# Patient Record
Sex: Female | Born: 1975 | Race: Black or African American | Hispanic: No | Smoking: Never smoker
Health system: Southern US, Community
[De-identification: ages and names within clinical notes are randomized; demographics above are authoritative.]

## PROBLEM LIST (undated history)

## (undated) DIAGNOSIS — I10 Essential (primary) hypertension: Secondary | ICD-10-CM

## (undated) DIAGNOSIS — I341 Nonrheumatic mitral (valve) prolapse: Secondary | ICD-10-CM

## (undated) DIAGNOSIS — J45909 Unspecified asthma, uncomplicated: Secondary | ICD-10-CM

---

## 2003-10-11 ENCOUNTER — Other Ambulatory Visit: Payer: Self-pay

## 2006-02-17 ENCOUNTER — Ambulatory Visit: Payer: Self-pay

## 2006-04-11 ENCOUNTER — Ambulatory Visit: Payer: Self-pay | Admitting: Psychiatry

## 2006-05-30 ENCOUNTER — Ambulatory Visit: Payer: Self-pay | Admitting: Psychiatry

## 2007-02-14 ENCOUNTER — Ambulatory Visit: Payer: Self-pay | Admitting: Urology

## 2008-11-17 ENCOUNTER — Emergency Department: Payer: Self-pay | Admitting: Emergency Medicine

## 2009-05-05 ENCOUNTER — Ambulatory Visit: Payer: Self-pay

## 2010-08-06 ENCOUNTER — Ambulatory Visit: Payer: Self-pay | Admitting: Internal Medicine

## 2013-02-26 ENCOUNTER — Ambulatory Visit: Payer: Self-pay | Admitting: Internal Medicine

## 2015-12-28 DIAGNOSIS — R011 Cardiac murmur, unspecified: Secondary | ICD-10-CM | POA: Diagnosis not present

## 2015-12-28 DIAGNOSIS — R5383 Other fatigue: Secondary | ICD-10-CM | POA: Diagnosis not present

## 2015-12-28 DIAGNOSIS — E559 Vitamin D deficiency, unspecified: Secondary | ICD-10-CM | POA: Diagnosis not present

## 2015-12-28 DIAGNOSIS — K219 Gastro-esophageal reflux disease without esophagitis: Secondary | ICD-10-CM | POA: Diagnosis not present

## 2015-12-28 DIAGNOSIS — R319 Hematuria, unspecified: Secondary | ICD-10-CM | POA: Diagnosis not present

## 2015-12-28 DIAGNOSIS — D649 Anemia, unspecified: Secondary | ICD-10-CM | POA: Diagnosis not present

## 2015-12-28 DIAGNOSIS — E7800 Pure hypercholesterolemia, unspecified: Secondary | ICD-10-CM | POA: Diagnosis not present

## 2015-12-28 DIAGNOSIS — J45909 Unspecified asthma, uncomplicated: Secondary | ICD-10-CM | POA: Diagnosis not present

## 2016-01-12 DIAGNOSIS — L239 Allergic contact dermatitis, unspecified cause: Secondary | ICD-10-CM | POA: Diagnosis not present

## 2016-01-14 DIAGNOSIS — D500 Iron deficiency anemia secondary to blood loss (chronic): Secondary | ICD-10-CM | POA: Diagnosis not present

## 2016-01-14 DIAGNOSIS — E669 Obesity, unspecified: Secondary | ICD-10-CM | POA: Diagnosis not present

## 2016-01-14 DIAGNOSIS — I471 Supraventricular tachycardia: Secondary | ICD-10-CM | POA: Diagnosis not present

## 2016-01-29 ENCOUNTER — Emergency Department: Payer: BLUE CROSS/BLUE SHIELD

## 2016-01-29 ENCOUNTER — Emergency Department
Admission: EM | Admit: 2016-01-29 | Discharge: 2016-01-29 | Disposition: A | Discharge status: Home / Self Care | Payer: BLUE CROSS/BLUE SHIELD | Attending: Emergency Medicine | Admitting: Emergency Medicine

## 2016-01-29 DIAGNOSIS — Z8679 Personal history of other diseases of the circulatory system: Secondary | ICD-10-CM | POA: Insufficient documentation

## 2016-01-29 DIAGNOSIS — J45909 Unspecified asthma, uncomplicated: Secondary | ICD-10-CM | POA: Insufficient documentation

## 2016-01-29 DIAGNOSIS — R079 Chest pain, unspecified: Secondary | ICD-10-CM | POA: Diagnosis not present

## 2016-01-29 DIAGNOSIS — R0789 Other chest pain: Secondary | ICD-10-CM | POA: Insufficient documentation

## 2016-01-29 DIAGNOSIS — I10 Essential (primary) hypertension: Secondary | ICD-10-CM | POA: Insufficient documentation

## 2016-01-29 HISTORY — DX: Unspecified asthma, uncomplicated: J45.909

## 2016-01-29 HISTORY — DX: Nonrheumatic mitral (valve) prolapse: I34.1

## 2016-01-29 HISTORY — DX: Essential (primary) hypertension: I10

## 2016-01-29 LAB — BASIC METABOLIC PANEL
Anion gap: 9 (ref 5–15)
BUN: 12 mg/dL (ref 6–20)
CALCIUM: 9.8 mg/dL (ref 8.9–10.3)
CO2: 27 mmol/L (ref 22–32)
CREATININE: 0.81 mg/dL (ref 0.44–1.00)
Chloride: 105 mmol/L (ref 101–111)
Glucose, Bld: 99 mg/dL (ref 65–99)
POTASSIUM: 4.4 mmol/L (ref 3.5–5.1)
SODIUM: 141 mmol/L (ref 135–145)

## 2016-01-29 LAB — CBC
HCT: 35.2 % (ref 35.0–47.0)
Hemoglobin: 11.6 g/dL — ABNORMAL LOW (ref 12.0–16.0)
MCH: 27.7 pg (ref 26.0–34.0)
MCHC: 33.1 g/dL (ref 32.0–36.0)
MCV: 83.5 fL (ref 80.0–100.0)
PLATELETS: 259 10*3/uL (ref 150–440)
RBC: 4.21 MIL/uL (ref 3.80–5.20)
RDW: 13.6 % (ref 11.5–14.5)
WBC: 5 10*3/uL (ref 3.6–11.0)

## 2016-01-29 LAB — TROPONIN I

## 2016-01-29 NOTE — ED Notes (Signed)
Patient reports left sided chest discomfort for 1 week, patient also reports increased fatigue and shortness of breath when walking long distances. Pain is worse when she is active, pain is better with rest. Patient denies N/V/D, fever/chills.  Patient reports that she has a hx/o irregular heart rate and take Metoprolol for this. Patient is followed by Dr. Juel BurrowMasoud.

## 2016-01-29 NOTE — ED Notes (Signed)
Pt reports she does not need a wheelchair. Pt in no acute distress at this time and is able to ambulate independently. Pt verbalized understanding of follow up.

## 2016-01-29 NOTE — ED Notes (Addendum)
Pt reports to ED w/ c/o chest pain that started 7 days ago.  Pt sts that pain radiates to L arm off/on for week, but pt pain in chest has been constant heaviness.  Pt sts that lightheaded w/ walking. Ambulatory to room.  Pt sts she was seen by cardiologist last week and received "everything was good"

## 2016-01-29 NOTE — Discharge Instructions (Signed)
You have been seen in the emergency department today for chest pain. Your workup has shown normal results. As we discussed please follow-up with your primary care physician in the next 1-2 days for recheck. Return to the emergency department for any further chest pain, trouble breathing, or any other symptom personally concerning to yourself. ° °Nonspecific Chest Pain °It is often hard to find the cause of chest pain. There is always a chance that your pain could be related to something serious, such as a heart attack or a blood clot in your lungs. Chest pain can also be caused by conditions that are not life-threatening. If you have chest pain, it is very important to follow up with your doctor. ° °HOME CARE °· If you were prescribed an antibiotic medicine, finish it all even if you start to feel better. °· Avoid any activities that cause chest pain. °· Do not use any tobacco products, including cigarettes, chewing tobacco, or electronic cigarettes. If you need help quitting, ask your doctor. °· Do not drink alcohol. °· Take medicines only as told by your doctor. °· Keep all follow-up visits as told by your doctor. This is important. This includes any further testing if your chest pain does not go away. °· Your doctor may tell you to keep your head raised (elevated) while you sleep. °· Make lifestyle changes as told by your doctor. These may include: °¨ Getting regular exercise. Ask your doctor to suggest some activities that are safe for you. °¨ Eating a heart-healthy diet. Your doctor or a diet specialist (dietitian) can help you to learn healthy eating options. °¨ Maintaining a healthy weight. °¨ Managing diabetes, if necessary. °¨ Reducing stress. °GET HELP IF: °· Your chest pain does not go away, even after treatment. °· You have a rash with blisters on your chest. °· You have a fever. °GET HELP RIGHT AWAY IF: °· Your chest pain is worse. °· You have an increasing cough, or you cough up blood. °· You have  severe belly (abdominal) pain. °· You feel extremely weak. °· You pass out (faint). °· You have chills. °· You have sudden, unexplained chest discomfort. °· You have sudden, unexplained discomfort in your arms, back, neck, or jaw. °· You have shortness of breath at any time. °· You suddenly start to sweat, or your skin gets clammy. °· You feel nauseous. °· You vomit. °· You suddenly feel light-headed or dizzy. °· Your heart begins to beat quickly, or it feels like it is skipping beats. °These symptoms may be an emergency. Do not wait to see if the symptoms will go away. Get medical help right away. Call your local emergency services (911 in the [place] Do not drive yourself to the hospital. °  °This information is not intended to replace advice given to you by your health care provider. Make sure you discuss any questions you have with your health care provider. °  °Document Released: 02/29/2008 Document Revised: 10/03/2014 Document Reviewed: 04/18/2014 °Elsevier Interactive Patient Education ©2016 [employer] ° °

## 2016-01-29 NOTE — ED Provider Notes (Signed)
[facility] Emergency Department Provider Note  Time seen: 4:42 PM  I have reviewed the triage vital signs and the nursing notes.   HISTORY  Chief Complaint Chest Pain    HPI Ashley Leon is a 40 y.o. female with a past medical history of hypertension, asthma, mitral valve prolapse, presents the emergency department 1 week of intermittent left-sided chest discomfort. According to the patient for the past one week she has been experiencing intermittent chest heaviness which she describes as mild to moderate with exertion, but occasionally comes at rest as well. Denies any nausea, vomiting, diaphoresis, does state some mild shortness of breath with exertion as well. Denies any leg pain or swelling. States she was seen by her cardiologist Dr. Harl Bowie approximately one week ago when symptoms started, but was told everything looked well. Has not had a stress test for approximately 5 years. Denies any history of MI in the past. She does have history of mitral valve prolapse for which she follows up with Dr. Harl Bowie. Currently the patient states very slight left-sided chest heaviness. She does state occasional numbness/tingling in her left arm but denies any currently. He states his left arm numbness and tingling comes and goes even without the chest pain or pressure.     Past Medical History  Diagnosis Date  . Mitral prolapse   . Hypertension   . Asthma     There are no active problems to display for this patient.   History reviewed. No pertinent past surgical history.  No current outpatient prescriptions on file.  Allergies Review of patient's allergies indicates no known allergies.  No family history on file.  Social History Social History  Substance Use Topics  . Smoking status: Never Smoker   . Smokeless tobacco: None  . Alcohol Use: No    Review of Systems Constitutional: Negative for fever. Cardiovascular: Intermittent left-sided chest  heaviness for the past one week Respiratory: Intermittent shortness of breath with exertion 1 only Gastrointestinal: Negative for abdominal pain, vomiting and diarrhea. Musculoskeletal: Negative for back pain Neurological: Negative for headache 10-point ROS otherwise negative.  ____________________________________________   PHYSICAL EXAM:  VITAL SIGNS: ED Triage Vitals  Enc Vitals Group     BP 01/29/16 1241 173/72 mmHg     Pulse Rate 01/29/16 1241 75     Resp 01/29/16 1241 18     Temp 01/29/16 1241 98 F (36.7 C)     Temp Source 01/29/16 1241 Oral     SpO2 01/29/16 1241 100 %     Weight 01/29/16 1241 187 lb (84.823 kg)     Height 01/29/16 1241  (1.727 m)     Head Cir --      Peak Flow --      Pain Score 01/29/16 1237 7     Pain Loc --      Pain Edu? --      Excl. in GC? --     Constitutional: Alert and oriented. Well appearing and in no distress. Eyes: Normal exam ENT   Head: Normocephalic and atraumatic.   Mouth/Throat: Mucous membranes are moist. Cardiovascular: Normal rate, regular rhythm. No murmur Respiratory: Normal respiratory effort without tachypnea nor retractions. Breath sounds are clear  Gastrointestinal: Soft and nontender. No distention.   Musculoskeletal: Nontender with normal range of motion in all extremities. No lower extremity tenderness or edema. Neurologic:  Normal speech and language. No gross focal neurologic deficits Skin:  Skin is warm, dry and intact.  Psychiatric:  Mood and affect are normal.  ____________________________________________    EKG  EKG reviewed and interpreted by myself shows normal sinus rhythm at 67 bpm, narrow QRS, normal axis, normal intervals, no ST changes. Normal EKG.  ____________________________________________    RADIOLOGY  Chest x-ray shows no acute abnormality  ____________________________________________    INITIAL IMPRESSION / ASSESSMENT AND PLAN / ED COURSE  Pertinent labs & imaging  results that were available during my care of the patient were reviewed by me and considered in my medical decision making (see chart for details).  Patient presents the emergency department 1 week of intermittent left-sided chest heaviness and shortness of breath. Patient's workup today including labs and troponin are negative. Chest x-ray negative. EKG is very reassuring and normal in appearance. Overall the patient appears very well, no abnormalities on exam. I discussed with the patient as his symptoms have been ongoing for 1 week or do believe she would benefit from a stress test. I have sent a message to Dr. Harl BowieMassoud, and she will call Monday morning to arrange an appointment. I discussed strict return precautions, the patient is agreeable.  ____________________________________________   FINAL CLINICAL IMPRESSION(S) / ED DIAGNOSES  Chest pain   Minna AntisKevin Kimie Pidcock, MD 01/29/16 [phone]

## 2016-02-02 DIAGNOSIS — Z789 Other specified health status: Secondary | ICD-10-CM | POA: Diagnosis not present

## 2016-02-02 DIAGNOSIS — R0789 Other chest pain: Secondary | ICD-10-CM | POA: Diagnosis not present

## 2016-02-02 DIAGNOSIS — I471 Supraventricular tachycardia: Secondary | ICD-10-CM | POA: Diagnosis not present

## 2016-02-04 DIAGNOSIS — D500 Iron deficiency anemia secondary to blood loss (chronic): Secondary | ICD-10-CM | POA: Diagnosis not present

## 2016-02-04 DIAGNOSIS — E669 Obesity, unspecified: Secondary | ICD-10-CM | POA: Diagnosis not present

## 2016-02-04 DIAGNOSIS — I471 Supraventricular tachycardia: Secondary | ICD-10-CM | POA: Diagnosis not present

## 2016-02-04 DIAGNOSIS — R079 Chest pain, unspecified: Secondary | ICD-10-CM | POA: Diagnosis not present

## 2016-02-08 DIAGNOSIS — E669 Obesity, unspecified: Secondary | ICD-10-CM | POA: Diagnosis not present

## 2016-02-08 DIAGNOSIS — I471 Supraventricular tachycardia: Secondary | ICD-10-CM | POA: Diagnosis not present

## 2016-02-08 DIAGNOSIS — R079 Chest pain, unspecified: Secondary | ICD-10-CM | POA: Diagnosis not present

## 2016-04-01 DIAGNOSIS — E042 Nontoxic multinodular goiter: Secondary | ICD-10-CM | POA: Diagnosis not present

## 2016-04-01 DIAGNOSIS — K219 Gastro-esophageal reflux disease without esophagitis: Secondary | ICD-10-CM | POA: Diagnosis not present

## 2016-04-01 DIAGNOSIS — R5383 Other fatigue: Secondary | ICD-10-CM | POA: Diagnosis not present

## 2016-04-01 DIAGNOSIS — J45909 Unspecified asthma, uncomplicated: Secondary | ICD-10-CM | POA: Diagnosis not present

## 2016-04-01 DIAGNOSIS — E785 Hyperlipidemia, unspecified: Secondary | ICD-10-CM | POA: Diagnosis not present

## 2016-04-01 DIAGNOSIS — D649 Anemia, unspecified: Secondary | ICD-10-CM | POA: Diagnosis not present

## 2016-04-01 DIAGNOSIS — R011 Cardiac murmur, unspecified: Secondary | ICD-10-CM | POA: Diagnosis not present

## 2016-04-01 DIAGNOSIS — E559 Vitamin D deficiency, unspecified: Secondary | ICD-10-CM | POA: Diagnosis not present

## 2016-05-16 DIAGNOSIS — R5381 Other malaise: Secondary | ICD-10-CM | POA: Diagnosis not present

## 2016-05-16 DIAGNOSIS — G43911 Migraine, unspecified, intractable, with status migrainosus: Secondary | ICD-10-CM | POA: Diagnosis not present

## 2016-05-16 DIAGNOSIS — I471 Supraventricular tachycardia: Secondary | ICD-10-CM | POA: Diagnosis not present

## 2016-05-16 DIAGNOSIS — I10 Essential (primary) hypertension: Secondary | ICD-10-CM | POA: Diagnosis not present

## 2016-05-16 DIAGNOSIS — E784 Other hyperlipidemia: Secondary | ICD-10-CM | POA: Diagnosis not present

## 2016-05-23 DIAGNOSIS — R000 Tachycardia, unspecified: Secondary | ICD-10-CM | POA: Diagnosis not present

## 2016-05-23 DIAGNOSIS — I340 Nonrheumatic mitral (valve) insufficiency: Secondary | ICD-10-CM | POA: Diagnosis not present

## 2016-06-23 DIAGNOSIS — M25521 Pain in right elbow: Secondary | ICD-10-CM | POA: Diagnosis not present

## 2016-07-04 DIAGNOSIS — R319 Hematuria, unspecified: Secondary | ICD-10-CM | POA: Diagnosis not present

## 2016-07-04 DIAGNOSIS — R011 Cardiac murmur, unspecified: Secondary | ICD-10-CM | POA: Diagnosis not present

## 2016-07-04 DIAGNOSIS — J45909 Unspecified asthma, uncomplicated: Secondary | ICD-10-CM | POA: Diagnosis not present

## 2016-07-04 DIAGNOSIS — E042 Nontoxic multinodular goiter: Secondary | ICD-10-CM | POA: Diagnosis not present

## 2016-07-04 DIAGNOSIS — R1030 Lower abdominal pain, unspecified: Secondary | ICD-10-CM | POA: Diagnosis not present

## 2016-07-04 DIAGNOSIS — K219 Gastro-esophageal reflux disease without esophagitis: Secondary | ICD-10-CM | POA: Diagnosis not present

## 2016-08-01 DIAGNOSIS — K219 Gastro-esophageal reflux disease without esophagitis: Secondary | ICD-10-CM | POA: Diagnosis not present

## 2016-08-01 DIAGNOSIS — R5383 Other fatigue: Secondary | ICD-10-CM | POA: Diagnosis not present

## 2016-08-01 DIAGNOSIS — J45909 Unspecified asthma, uncomplicated: Secondary | ICD-10-CM | POA: Diagnosis not present

## 2016-08-01 DIAGNOSIS — R011 Cardiac murmur, unspecified: Secondary | ICD-10-CM | POA: Diagnosis not present

## 2016-08-01 DIAGNOSIS — E559 Vitamin D deficiency, unspecified: Secondary | ICD-10-CM | POA: Diagnosis not present

## 2016-08-01 DIAGNOSIS — E785 Hyperlipidemia, unspecified: Secondary | ICD-10-CM | POA: Diagnosis not present

## 2016-08-22 DIAGNOSIS — I32 Pericarditis in diseases classified elsewhere: Secondary | ICD-10-CM | POA: Diagnosis not present

## 2016-08-22 DIAGNOSIS — E669 Obesity, unspecified: Secondary | ICD-10-CM | POA: Diagnosis not present

## 2016-08-22 DIAGNOSIS — D649 Anemia, unspecified: Secondary | ICD-10-CM | POA: Diagnosis not present

## 2016-08-22 DIAGNOSIS — D500 Iron deficiency anemia secondary to blood loss (chronic): Secondary | ICD-10-CM | POA: Diagnosis not present

## 2016-11-03 DIAGNOSIS — R5383 Other fatigue: Secondary | ICD-10-CM | POA: Diagnosis not present

## 2016-11-03 DIAGNOSIS — R011 Cardiac murmur, unspecified: Secondary | ICD-10-CM | POA: Diagnosis not present

## 2016-11-03 DIAGNOSIS — K219 Gastro-esophageal reflux disease without esophagitis: Secondary | ICD-10-CM | POA: Diagnosis not present

## 2016-11-03 DIAGNOSIS — E559 Vitamin D deficiency, unspecified: Secondary | ICD-10-CM | POA: Diagnosis not present

## 2016-11-03 DIAGNOSIS — J45909 Unspecified asthma, uncomplicated: Secondary | ICD-10-CM | POA: Diagnosis not present

## 2016-11-03 DIAGNOSIS — D649 Anemia, unspecified: Secondary | ICD-10-CM | POA: Diagnosis not present

## 2016-11-03 DIAGNOSIS — E785 Hyperlipidemia, unspecified: Secondary | ICD-10-CM | POA: Diagnosis not present

## 2016-12-20 DIAGNOSIS — D649 Anemia, unspecified: Secondary | ICD-10-CM | POA: Diagnosis not present

## 2016-12-20 DIAGNOSIS — E669 Obesity, unspecified: Secondary | ICD-10-CM | POA: Diagnosis not present

## 2016-12-20 DIAGNOSIS — I32 Pericarditis in diseases classified elsewhere: Secondary | ICD-10-CM | POA: Diagnosis not present

## 2016-12-20 DIAGNOSIS — D500 Iron deficiency anemia secondary to blood loss (chronic): Secondary | ICD-10-CM | POA: Diagnosis not present

## 2017-02-14 DIAGNOSIS — R011 Cardiac murmur, unspecified: Secondary | ICD-10-CM | POA: Diagnosis not present

## 2017-02-14 DIAGNOSIS — D649 Anemia, unspecified: Secondary | ICD-10-CM | POA: Diagnosis not present

## 2017-02-14 DIAGNOSIS — E559 Vitamin D deficiency, unspecified: Secondary | ICD-10-CM | POA: Diagnosis not present

## 2017-02-14 DIAGNOSIS — J45909 Unspecified asthma, uncomplicated: Secondary | ICD-10-CM | POA: Diagnosis not present

## 2017-02-14 DIAGNOSIS — R200 Anesthesia of skin: Secondary | ICD-10-CM | POA: Diagnosis not present

## 2017-02-14 DIAGNOSIS — R5383 Other fatigue: Secondary | ICD-10-CM | POA: Diagnosis not present

## 2017-02-14 DIAGNOSIS — E785 Hyperlipidemia, unspecified: Secondary | ICD-10-CM | POA: Diagnosis not present

## 2017-03-23 DIAGNOSIS — D500 Iron deficiency anemia secondary to blood loss (chronic): Secondary | ICD-10-CM | POA: Diagnosis not present

## 2017-03-23 DIAGNOSIS — D649 Anemia, unspecified: Secondary | ICD-10-CM | POA: Diagnosis not present

## 2017-03-23 DIAGNOSIS — E669 Obesity, unspecified: Secondary | ICD-10-CM | POA: Diagnosis not present

## 2017-03-23 DIAGNOSIS — I32 Pericarditis in diseases classified elsewhere: Secondary | ICD-10-CM | POA: Diagnosis not present

## 2017-04-20 IMAGING — CR DG CHEST 2V
2 series · 2 of 2 positions shown · non-contrast
Comparison: August 06, 2010

CLINICAL DATA: Chest pain, primarily left-sided, for 1 week

EXAM:
CHEST  2 VIEW

[chest pa]
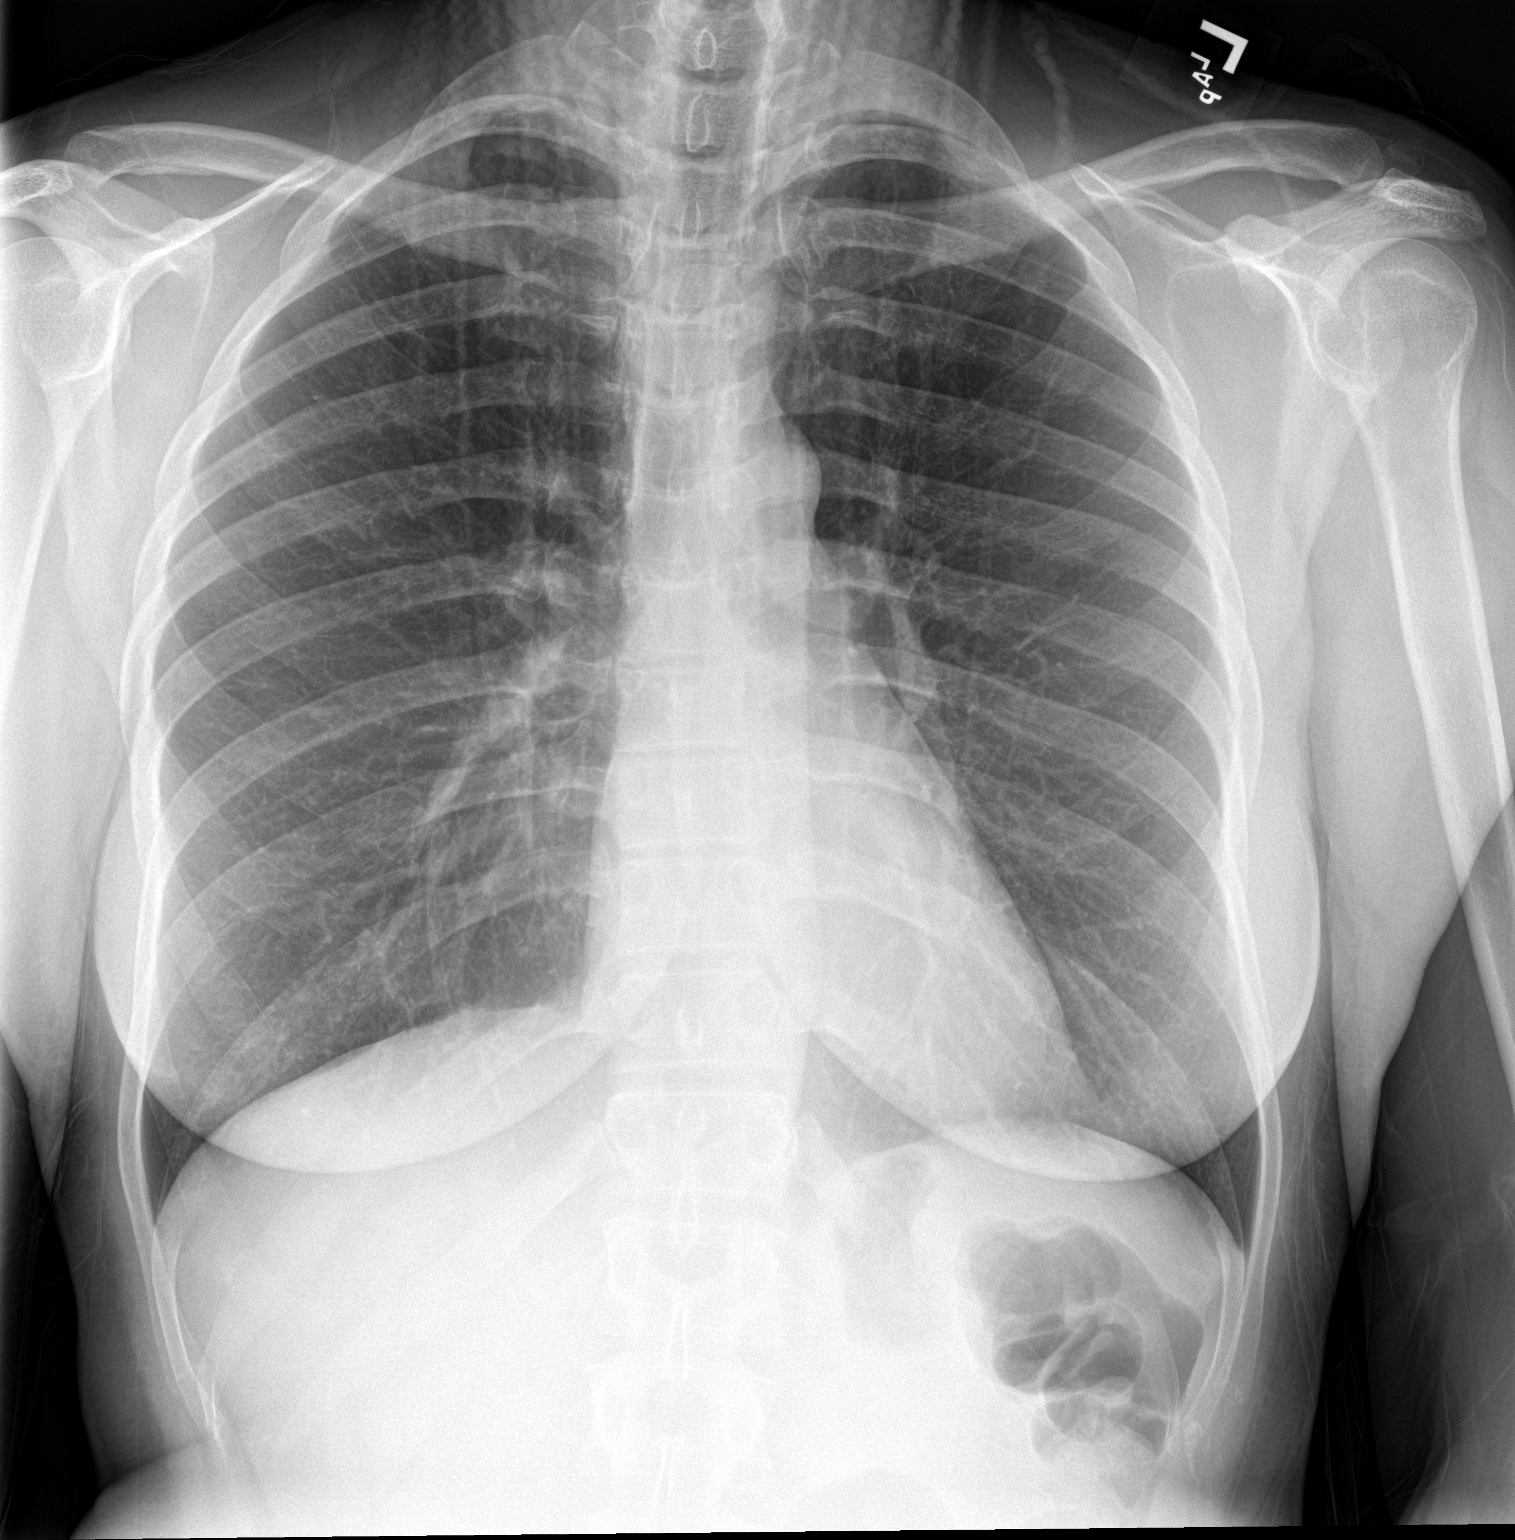

[chest lat]
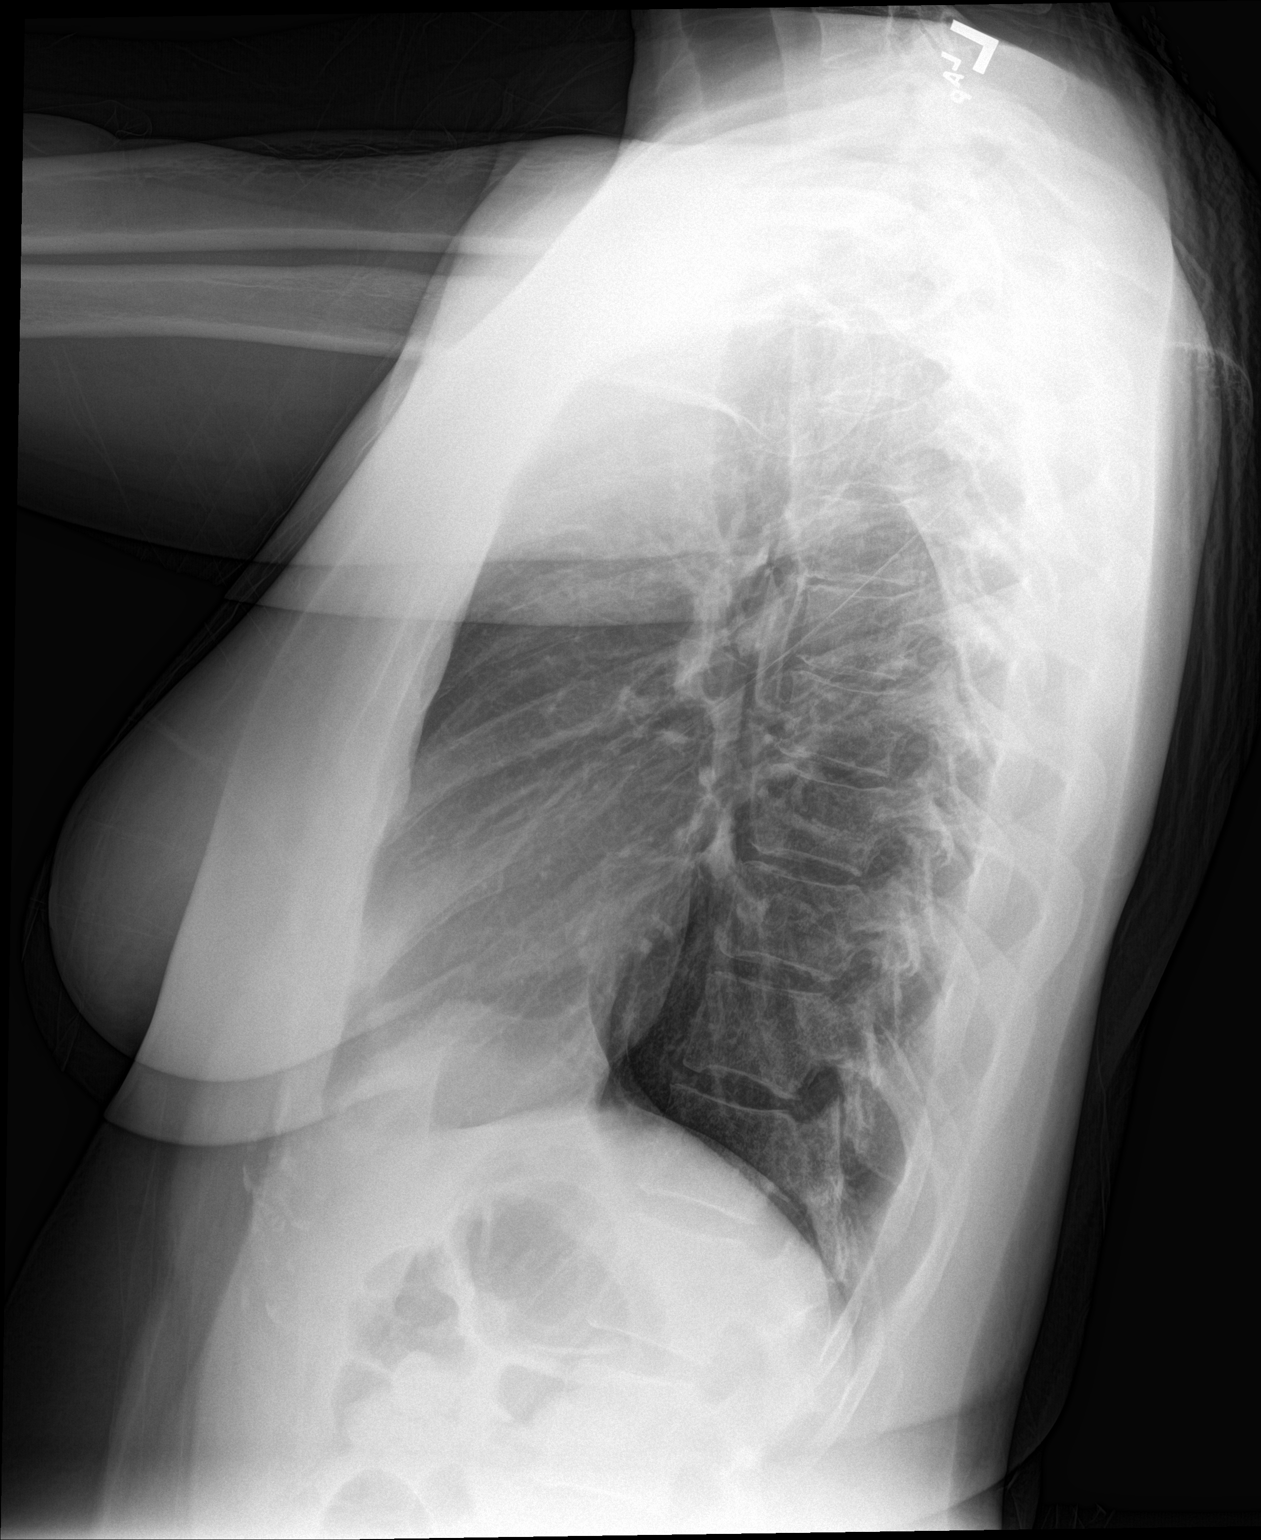

[2 of 2 positions shown; findings below may reference images not displayed]

FINDINGS: Lungs are clear. Heart size and pulmonary vascularity are normal. No
adenopathy. No pneumothorax. No bone lesions.
IMPRESSION: No edema or consolidation.

## 2017-05-11 DIAGNOSIS — D649 Anemia, unspecified: Secondary | ICD-10-CM | POA: Diagnosis not present

## 2017-05-11 DIAGNOSIS — E559 Vitamin D deficiency, unspecified: Secondary | ICD-10-CM | POA: Diagnosis not present

## 2017-05-11 DIAGNOSIS — K219 Gastro-esophageal reflux disease without esophagitis: Secondary | ICD-10-CM | POA: Diagnosis not present

## 2017-05-11 DIAGNOSIS — R011 Cardiac murmur, unspecified: Secondary | ICD-10-CM | POA: Diagnosis not present

## 2017-05-11 DIAGNOSIS — R5383 Other fatigue: Secondary | ICD-10-CM | POA: Diagnosis not present

## 2017-05-11 DIAGNOSIS — E785 Hyperlipidemia, unspecified: Secondary | ICD-10-CM | POA: Diagnosis not present

## 2017-05-11 DIAGNOSIS — J45909 Unspecified asthma, uncomplicated: Secondary | ICD-10-CM | POA: Diagnosis not present

## 2017-05-30 ENCOUNTER — Encounter: Payer: Self-pay | Admitting: Advanced Practice Midwife

## 2017-05-30 ENCOUNTER — Ambulatory Visit (INDEPENDENT_AMBULATORY_CARE_PROVIDER_SITE_OTHER): Payer: BLUE CROSS/BLUE SHIELD | Admitting: Advanced Practice Midwife

## 2017-05-30 VITALS — BP 124/70 | Ht 68.0 in | Wt 192.0 lb

## 2017-05-30 DIAGNOSIS — Z30011 Encounter for initial prescription of contraceptive pills: Secondary | ICD-10-CM

## 2017-05-30 DIAGNOSIS — Z01419 Encounter for gynecological examination (general) (routine) without abnormal findings: Secondary | ICD-10-CM

## 2017-05-30 DIAGNOSIS — Z124 Encounter for screening for malignant neoplasm of cervix: Secondary | ICD-10-CM

## 2017-05-30 DIAGNOSIS — I341 Nonrheumatic mitral (valve) prolapse: Secondary | ICD-10-CM | POA: Insufficient documentation

## 2017-05-30 MED ORDER — NORETHIN ACE-ETH ESTRAD-FE 1-20 MG-MCG PO TABS: 1.0000 | ORAL_TABLET | Freq: Every day | ORAL | 11 refills | Status: DC

## 2017-05-30 NOTE — Progress Notes (Signed)
Patient ID: Ashley Leon, female   DOB: 1975/09/28, 41 y.o.   MRN: [id number]    Gynecology Annual Exam  PCP: Evelene Croon, MD  Chief Complaint:  Chief Complaint  Patient presents with  . Annual Exam    History of Present Illness: Patient is a 41 y.o. G2P1011 presents for annual exam. The patient has no complaints today.   LMP: Patient's last menstrual period was 04/25/2017. Average Interval: irregular, every 1-2 months  Duration of flow: 5 days Heavy Menses: no Clots: no Intermenstrual Bleeding: no Postcoital Bleeding: no Dysmenorrhea: no   The patient is sexually active. She currently uses none for contraception. She denies dyspareunia.  The patient does perform self breast exams.  There is no notable family history of breast or ovarian cancer in her family.  The patient wears seatbelts: yes.   The patient has regular exercise: yes.    The patient denies current symptoms of depression.    Review of Systems: Review of Systems  Constitutional: Negative.   HENT: Negative.   Eyes: Negative.   Respiratory: Negative.   Cardiovascular: Negative.   Gastrointestinal: Negative.   Genitourinary: Negative.   Musculoskeletal: Negative.   Skin: Negative.   Neurological: Negative.   Endo/Heme/Allergies: Negative.   Psychiatric/Behavioral: Negative.     Past Medical History:  Past Medical History:  Diagnosis Date  . Asthma   . Hypertension   . Mitral prolapse     Past Surgical History:  History reviewed. No pertinent surgical history.  Gynecologic History:  Patient's last menstrual period was 04/25/2017. Contraception: none Last Pap: 1.5 years ago Results were:  no abnormalities  Last mammogram: several years ago, Results were: BI-RAD I Obstetric History: G2P1011  Family History:  History reviewed. No pertinent family history.  Social History:  Social History   Social History  . Marital status: Divorced    Spouse name: N/A  . Number of  children: N/A  . Years of education: N/A   Occupational History  . Not on file.   Social History Main Topics  . Smoking status: Never Smoker  . Smokeless tobacco: Never Used  . Alcohol use No  . Drug use: No  . Sexual activity: Not Currently    Birth control/ protection: None   Other Topics Concern  . Not on file   Social History Narrative  . No narrative on file    Allergies:  No Known Allergies  Medications: Prior to Admission medications   Medication Sig Start Date End Date Taking? Authorizing Provider  amLODipine (NORVASC) 2.5 MG tablet  04/02/17   [provider]  BREO ELLIPTA 100-25 MCG/INH AEPB  05/11/17   [provider]  metoprolol tartrate (LOPRESSOR) 50 MG tablet Take 50 mg by mouth.    [provider]  pantoprazole (PROTONIX) 40 MG tablet  05/11/17   [provider]  PROAIR HFA 108 [phone] Base) MCG/ACT inhaler  05/11/17   [provider]  SUMAtriptan (IMITREX) 100 MG tablet  05/11/17   [provider]    Physical Exam Vitals: Blood pressure 124/70, height 5\' 8"  (1.727 m), weight 192 lb (87.1 kg), last menstrual period 04/25/2017.  General: NAD HEENT: normocephalic, anicteric Thyroid: no enlargement, no palpable nodules Pulmonary: No increased work of breathing, CTAB Cardiovascular: RRR, distal pulses 2+ Breast: Breast symmetrical, no tenderness, no palpable nodules or masses, no skin or nipple retraction present, no nipple discharge.  No axillary or supraclavicular lymphadenopathy. Abdomen: NABS, soft, non-tender, non-distended.  Umbilicus without lesions.  No hepatomegaly, splenomegaly or masses palpable. No evidence of hernia  Genitourinary:  External: Normal external female genitalia.  Normal urethral meatus, normal  Bartholin's and Skene's glands.    Vagina: Normal vaginal mucosa, no evidence of prolapse.    Cervix: Grossly normal in appearance, no bleeding, no CMT  Uterus: Non-enlarged, mobile, normal  contour.    Adnexa: ovaries non-enlarged, no adnexal masses  Rectal: deferred  Lymphatic: no evidence of inguinal lymphadenopathy Extremities: no edema, erythema, or tenderness Neurologic: Grossly intact Psychiatric: mood appropriate, affect full   Assessment: 41 y.o. G2P1011 routine annual exam  Plan: Problem List Items Addressed This Visit    None    Visit Diagnoses    Well woman exam with routine gynecological exam    -  Primary   Relevant Orders   IGP, Aptima HPV   Cervical cancer screening       Relevant Orders   IGP, Aptima HPV      1) Mammogram - recommend yearly screening mammogram.  Mammogram patient prefers to wait until next year   2) STI screening was offered and declined  3) ASCCP guidelines and rational discussed.  Patient opts for yearly screening interval  4) Contraception - patient would like to take birth control pills to regulate cycle  5) Colonoscopy -- Screening recommended starting at age 43 for average risk individuals, age 43 for individuals deemed at increased risk (including African Americans) and recommended to continue until age 43.  For patient age 43-85 individualized approach is recommended.  Gold standard screening is via colonoscopy, Cologuard screening is an acceptable alternative for patient unwilling or unable to undergo colonoscopy.  "Colorectal cancer screening for average?risk adults: 2018 guideline update from the American Cancer Society"CA: A Cancer Journal for Clinicians: Feb 22, 2017. Reviewed recommendations and patient prefers to wait  6) Routine healthcare maintenance including cholesterol, diabetes screening discussed managed by PCP  Tresea MallJane Nicollette Wilhelmi, CNM

## 2017-06-02 LAB — IGP, APTIMA HPV
HPV APTIMA: NEGATIVE
PAP SMEAR COMMENT: 0

## 2017-06-05 ENCOUNTER — Other Ambulatory Visit: Payer: Self-pay | Admitting: Internal Medicine

## 2017-06-05 ENCOUNTER — Ambulatory Visit
Admission: RE | Admit: 2017-06-05 | Discharge: 2017-06-05 | Disposition: A | Discharge status: Home / Self Care | Payer: BLUE CROSS/BLUE SHIELD | Source: Ambulatory Visit | Attending: Cardiology | Admitting: Cardiology

## 2017-06-05 ENCOUNTER — Ambulatory Visit
Admission: RE | Admit: 2017-06-05 | Discharge: 2017-06-05 | Disposition: A | Discharge status: Home / Self Care | Payer: BLUE CROSS/BLUE SHIELD | Source: Ambulatory Visit | Attending: Internal Medicine | Admitting: Internal Medicine

## 2017-06-05 DIAGNOSIS — D500 Iron deficiency anemia secondary to blood loss (chronic): Secondary | ICD-10-CM | POA: Diagnosis not present

## 2017-06-05 DIAGNOSIS — R079 Chest pain, unspecified: Secondary | ICD-10-CM | POA: Insufficient documentation

## 2017-06-05 DIAGNOSIS — D649 Anemia, unspecified: Secondary | ICD-10-CM | POA: Diagnosis not present

## 2017-06-05 DIAGNOSIS — I10 Essential (primary) hypertension: Secondary | ICD-10-CM | POA: Diagnosis not present

## 2017-06-05 DIAGNOSIS — G43911 Migraine, unspecified, intractable, with status migrainosus: Secondary | ICD-10-CM | POA: Diagnosis not present

## 2017-06-05 DIAGNOSIS — R5381 Other malaise: Secondary | ICD-10-CM | POA: Diagnosis not present

## 2017-06-05 DIAGNOSIS — E784 Other hyperlipidemia: Secondary | ICD-10-CM | POA: Diagnosis not present

## 2017-06-05 DIAGNOSIS — I32 Pericarditis in diseases classified elsewhere: Secondary | ICD-10-CM | POA: Diagnosis not present

## 2017-06-12 DIAGNOSIS — D500 Iron deficiency anemia secondary to blood loss (chronic): Secondary | ICD-10-CM | POA: Diagnosis not present

## 2017-06-12 DIAGNOSIS — R002 Palpitations: Secondary | ICD-10-CM | POA: Diagnosis not present

## 2017-06-12 DIAGNOSIS — J45909 Unspecified asthma, uncomplicated: Secondary | ICD-10-CM | POA: Diagnosis not present

## 2017-06-12 DIAGNOSIS — Z888 Allergy status to other drugs, medicaments and biological substances status: Secondary | ICD-10-CM | POA: Diagnosis not present

## 2017-06-14 DIAGNOSIS — R002 Palpitations: Secondary | ICD-10-CM | POA: Diagnosis not present

## 2017-06-14 DIAGNOSIS — R000 Tachycardia, unspecified: Secondary | ICD-10-CM | POA: Diagnosis not present

## 2017-06-14 DIAGNOSIS — I471 Supraventricular tachycardia: Secondary | ICD-10-CM | POA: Diagnosis not present

## 2017-07-18 DIAGNOSIS — I32 Pericarditis in diseases classified elsewhere: Secondary | ICD-10-CM | POA: Diagnosis not present

## 2017-07-18 DIAGNOSIS — D649 Anemia, unspecified: Secondary | ICD-10-CM | POA: Diagnosis not present

## 2017-07-18 DIAGNOSIS — I471 Supraventricular tachycardia: Secondary | ICD-10-CM | POA: Diagnosis not present

## 2017-07-18 DIAGNOSIS — I10 Essential (primary) hypertension: Secondary | ICD-10-CM | POA: Diagnosis not present

## 2017-08-25 DIAGNOSIS — K219 Gastro-esophageal reflux disease without esophagitis: Secondary | ICD-10-CM | POA: Diagnosis not present

## 2017-08-25 DIAGNOSIS — R319 Hematuria, unspecified: Secondary | ICD-10-CM | POA: Diagnosis not present

## 2017-08-25 DIAGNOSIS — R011 Cardiac murmur, unspecified: Secondary | ICD-10-CM | POA: Diagnosis not present

## 2017-08-25 DIAGNOSIS — D649 Anemia, unspecified: Secondary | ICD-10-CM | POA: Diagnosis not present

## 2017-08-25 DIAGNOSIS — J45909 Unspecified asthma, uncomplicated: Secondary | ICD-10-CM | POA: Diagnosis not present

## 2017-09-29 DIAGNOSIS — E785 Hyperlipidemia, unspecified: Secondary | ICD-10-CM | POA: Diagnosis not present

## 2017-09-29 DIAGNOSIS — R5383 Other fatigue: Secondary | ICD-10-CM | POA: Diagnosis not present

## 2017-09-29 DIAGNOSIS — R011 Cardiac murmur, unspecified: Secondary | ICD-10-CM | POA: Diagnosis not present

## 2017-09-29 DIAGNOSIS — E559 Vitamin D deficiency, unspecified: Secondary | ICD-10-CM | POA: Diagnosis not present

## 2017-09-29 DIAGNOSIS — K219 Gastro-esophageal reflux disease without esophagitis: Secondary | ICD-10-CM | POA: Diagnosis not present

## 2017-09-29 DIAGNOSIS — J45909 Unspecified asthma, uncomplicated: Secondary | ICD-10-CM | POA: Diagnosis not present

## 2017-09-29 DIAGNOSIS — R319 Hematuria, unspecified: Secondary | ICD-10-CM | POA: Diagnosis not present

## 2017-10-17 DIAGNOSIS — D649 Anemia, unspecified: Secondary | ICD-10-CM | POA: Diagnosis not present

## 2017-10-17 DIAGNOSIS — I32 Pericarditis in diseases classified elsewhere: Secondary | ICD-10-CM | POA: Diagnosis not present

## 2017-10-17 DIAGNOSIS — Z888 Allergy status to other drugs, medicaments and biological substances status: Secondary | ICD-10-CM | POA: Diagnosis not present

## 2017-10-17 DIAGNOSIS — R002 Palpitations: Secondary | ICD-10-CM | POA: Diagnosis not present

## 2017-12-29 DIAGNOSIS — E559 Vitamin D deficiency, unspecified: Secondary | ICD-10-CM | POA: Diagnosis not present

## 2017-12-29 DIAGNOSIS — K219 Gastro-esophageal reflux disease without esophagitis: Secondary | ICD-10-CM | POA: Diagnosis not present

## 2017-12-29 DIAGNOSIS — D649 Anemia, unspecified: Secondary | ICD-10-CM | POA: Diagnosis not present

## 2017-12-29 DIAGNOSIS — R011 Cardiac murmur, unspecified: Secondary | ICD-10-CM | POA: Diagnosis not present

## 2017-12-29 DIAGNOSIS — E785 Hyperlipidemia, unspecified: Secondary | ICD-10-CM | POA: Diagnosis not present

## 2017-12-29 DIAGNOSIS — R5383 Other fatigue: Secondary | ICD-10-CM | POA: Diagnosis not present

## 2017-12-29 DIAGNOSIS — J45909 Unspecified asthma, uncomplicated: Secondary | ICD-10-CM | POA: Diagnosis not present

## 2018-01-15 DIAGNOSIS — I10 Essential (primary) hypertension: Secondary | ICD-10-CM | POA: Diagnosis not present

## 2018-01-15 DIAGNOSIS — E669 Obesity, unspecified: Secondary | ICD-10-CM | POA: Diagnosis not present

## 2018-01-15 DIAGNOSIS — G43909 Migraine, unspecified, not intractable, without status migrainosus: Secondary | ICD-10-CM | POA: Diagnosis not present

## 2018-01-15 DIAGNOSIS — I471 Supraventricular tachycardia: Secondary | ICD-10-CM | POA: Diagnosis not present

## 2018-03-30 DIAGNOSIS — R319 Hematuria, unspecified: Secondary | ICD-10-CM | POA: Diagnosis not present

## 2018-03-30 DIAGNOSIS — E785 Hyperlipidemia, unspecified: Secondary | ICD-10-CM | POA: Diagnosis not present

## 2018-03-30 DIAGNOSIS — J45909 Unspecified asthma, uncomplicated: Secondary | ICD-10-CM | POA: Diagnosis not present

## 2018-03-30 DIAGNOSIS — K219 Gastro-esophageal reflux disease without esophagitis: Secondary | ICD-10-CM | POA: Diagnosis not present

## 2018-03-30 DIAGNOSIS — R5383 Other fatigue: Secondary | ICD-10-CM | POA: Diagnosis not present

## 2018-03-30 DIAGNOSIS — G43909 Migraine, unspecified, not intractable, without status migrainosus: Secondary | ICD-10-CM | POA: Diagnosis not present

## 2018-04-16 ENCOUNTER — Ambulatory Visit
Admission: RE | Admit: 2018-04-16 | Discharge: 2018-04-16 | Disposition: A | Discharge status: Home / Self Care | Payer: BLUE CROSS/BLUE SHIELD | Source: Ambulatory Visit | Attending: Internal Medicine | Admitting: Internal Medicine

## 2018-04-16 ENCOUNTER — Other Ambulatory Visit: Payer: Self-pay | Admitting: Internal Medicine

## 2018-04-16 DIAGNOSIS — R52 Pain, unspecified: Secondary | ICD-10-CM

## 2018-04-16 DIAGNOSIS — D500 Iron deficiency anemia secondary to blood loss (chronic): Secondary | ICD-10-CM | POA: Diagnosis not present

## 2018-04-16 DIAGNOSIS — H269 Unspecified cataract: Secondary | ICD-10-CM | POA: Diagnosis not present

## 2018-04-16 DIAGNOSIS — M940 Chondrocostal junction syndrome [Tietze]: Secondary | ICD-10-CM | POA: Diagnosis not present

## 2018-04-16 DIAGNOSIS — R002 Palpitations: Secondary | ICD-10-CM | POA: Diagnosis not present

## 2018-04-16 DIAGNOSIS — R079 Chest pain, unspecified: Secondary | ICD-10-CM | POA: Insufficient documentation

## 2018-04-23 DIAGNOSIS — R002 Palpitations: Secondary | ICD-10-CM | POA: Diagnosis not present

## 2018-04-23 DIAGNOSIS — M940 Chondrocostal junction syndrome [Tietze]: Secondary | ICD-10-CM | POA: Diagnosis not present

## 2018-04-23 DIAGNOSIS — D649 Anemia, unspecified: Secondary | ICD-10-CM | POA: Diagnosis not present

## 2018-04-23 DIAGNOSIS — D500 Iron deficiency anemia secondary to blood loss (chronic): Secondary | ICD-10-CM | POA: Diagnosis not present

## 2018-06-28 DIAGNOSIS — M2141 Flat foot [pes planus] (acquired), right foot: Secondary | ICD-10-CM | POA: Diagnosis not present

## 2018-06-28 DIAGNOSIS — M2142 Flat foot [pes planus] (acquired), left foot: Secondary | ICD-10-CM | POA: Diagnosis not present

## 2018-06-28 DIAGNOSIS — M76822 Posterior tibial tendinitis, left leg: Secondary | ICD-10-CM | POA: Diagnosis not present

## 2018-06-28 DIAGNOSIS — M79672 Pain in left foot: Secondary | ICD-10-CM | POA: Diagnosis not present

## 2018-06-29 DIAGNOSIS — R319 Hematuria, unspecified: Secondary | ICD-10-CM | POA: Diagnosis not present

## 2018-06-29 DIAGNOSIS — J45909 Unspecified asthma, uncomplicated: Secondary | ICD-10-CM | POA: Diagnosis not present

## 2018-06-29 DIAGNOSIS — R011 Cardiac murmur, unspecified: Secondary | ICD-10-CM | POA: Diagnosis not present

## 2018-06-29 DIAGNOSIS — K219 Gastro-esophageal reflux disease without esophagitis: Secondary | ICD-10-CM | POA: Diagnosis not present

## 2018-06-29 DIAGNOSIS — Z1331 Encounter for screening for depression: Secondary | ICD-10-CM | POA: Diagnosis not present

## 2018-07-24 DIAGNOSIS — M940 Chondrocostal junction syndrome [Tietze]: Secondary | ICD-10-CM | POA: Diagnosis not present

## 2018-07-24 DIAGNOSIS — R002 Palpitations: Secondary | ICD-10-CM | POA: Diagnosis not present

## 2018-07-24 DIAGNOSIS — I32 Pericarditis in diseases classified elsewhere: Secondary | ICD-10-CM | POA: Diagnosis not present

## 2018-07-24 DIAGNOSIS — D500 Iron deficiency anemia secondary to blood loss (chronic): Secondary | ICD-10-CM | POA: Diagnosis not present

## 2018-07-30 DIAGNOSIS — M76822 Posterior tibial tendinitis, left leg: Secondary | ICD-10-CM | POA: Diagnosis not present

## 2018-08-26 IMAGING — CR DG CHEST 2V
1 series · 2 of 2 positions shown · non-contrast
Comparison: PA and lateral chest x-ray January 29, 2016

CLINICAL DATA: Left-sided chest pain for the past week. History of
asthma and mitral valve prolapse.

EXAM:
CHEST  2 VIEW

[Series 1: dg chest 2 view · 0.14mm/px · 2 of 2 slices shown]
[im 1/2]
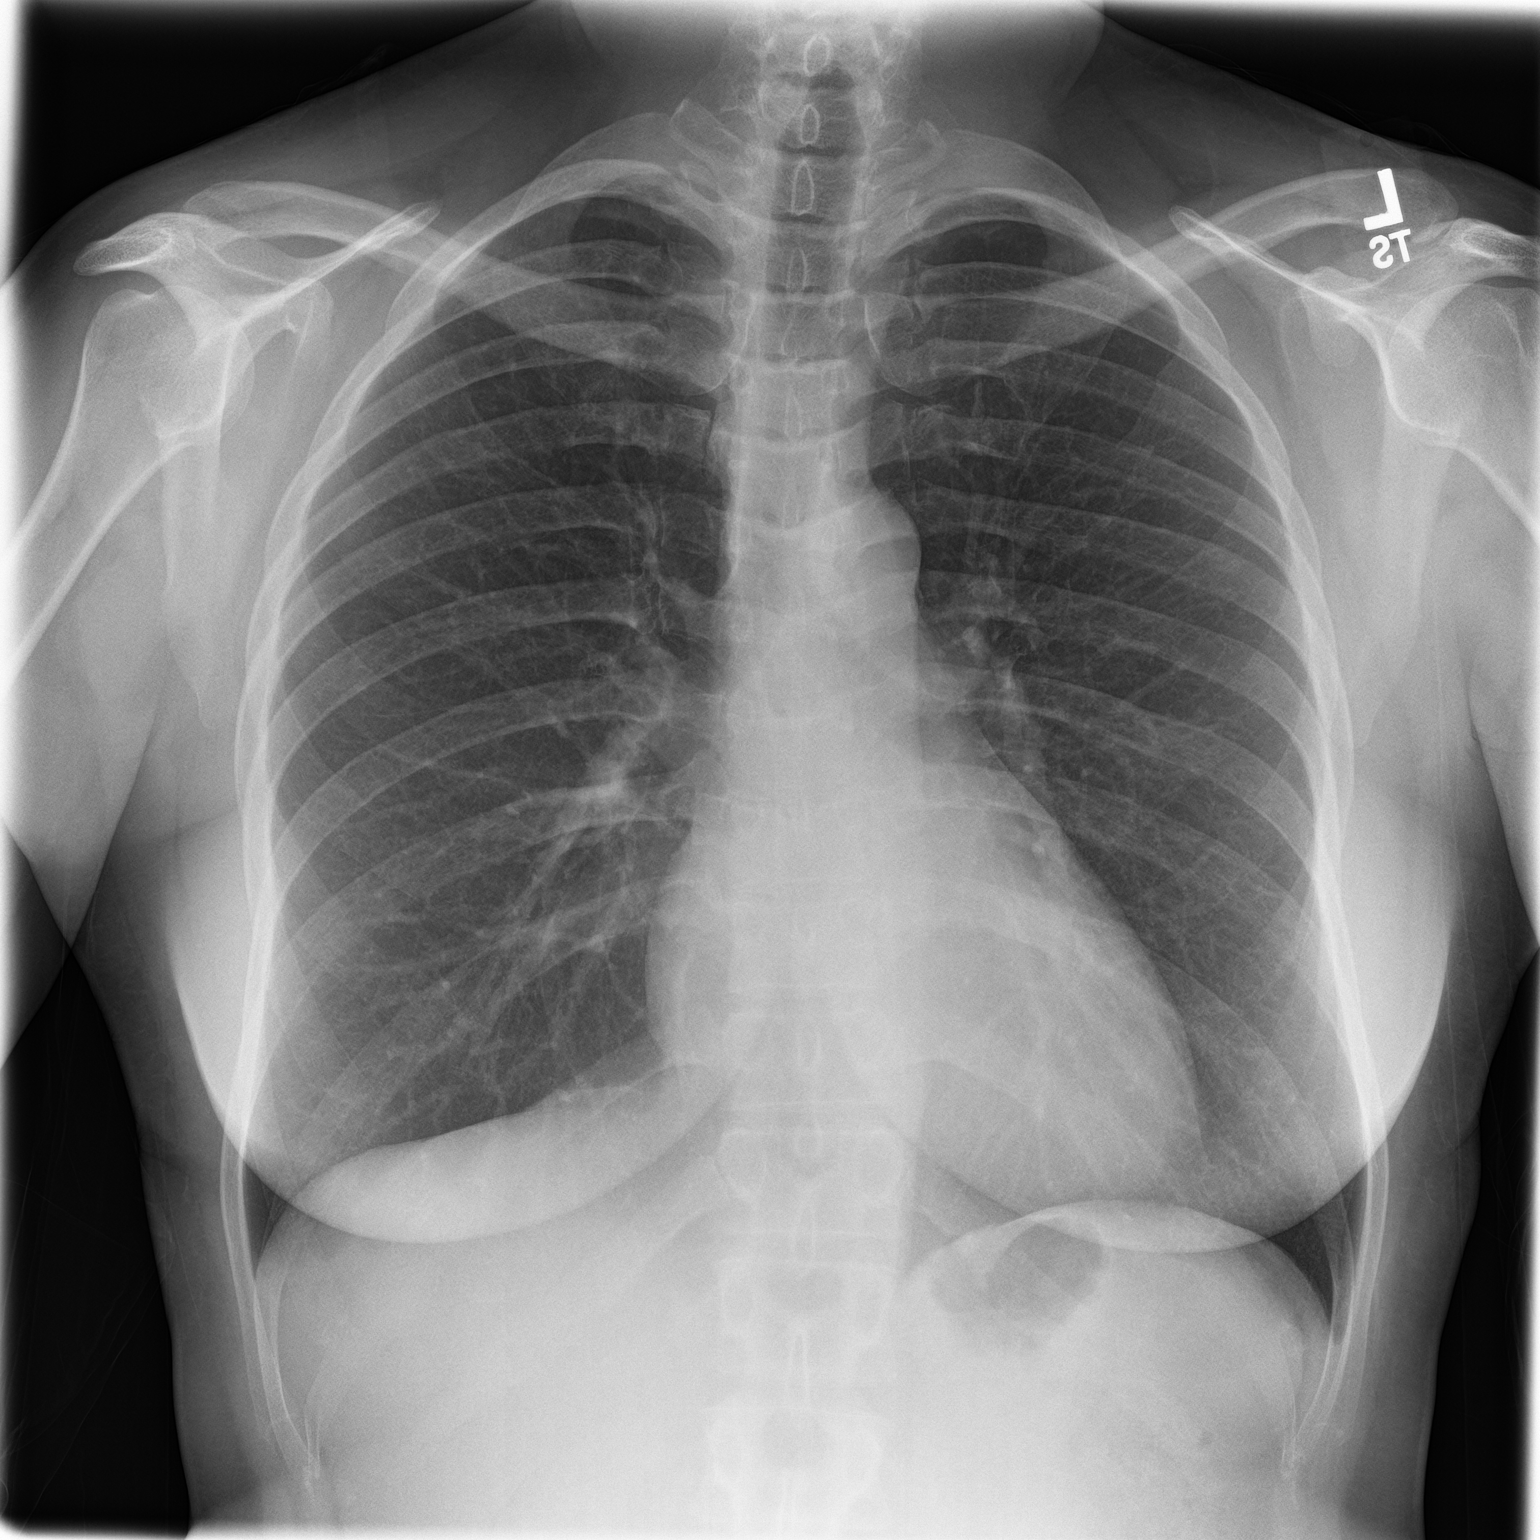
[im 2/2]
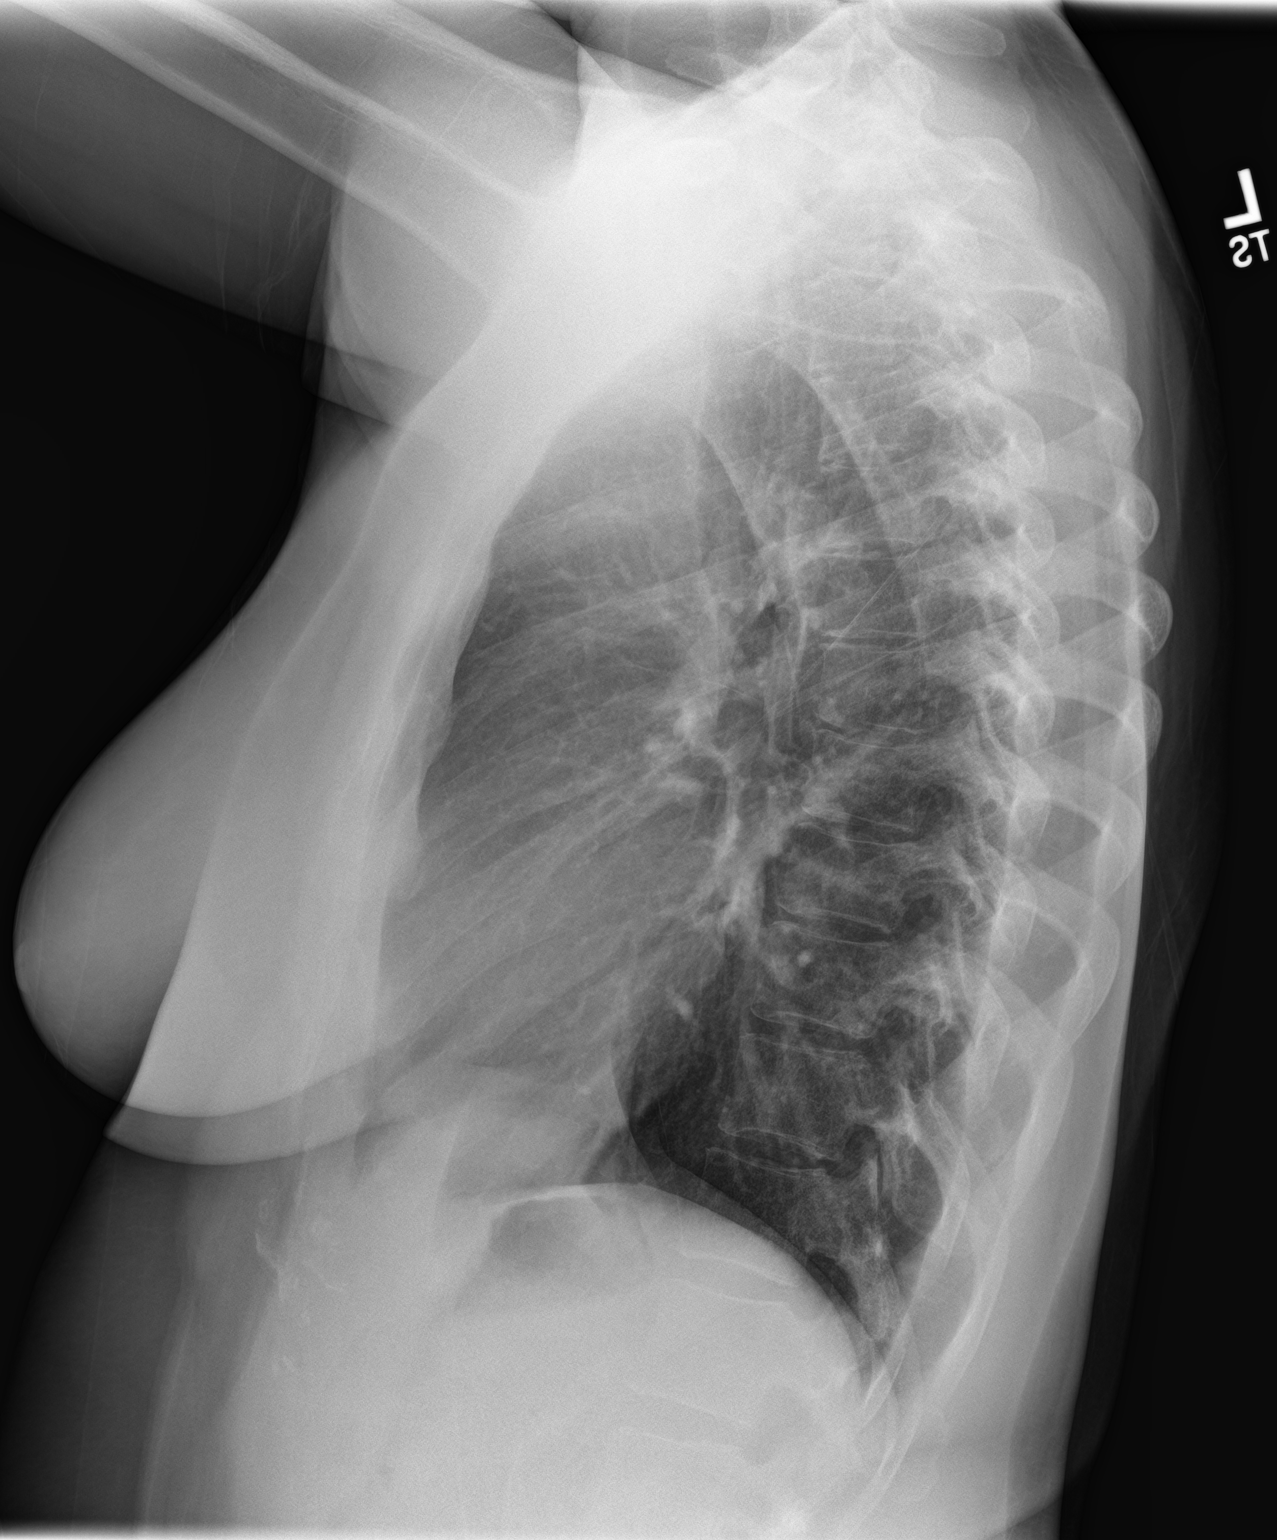

[2 of 2 positions shown; findings below may reference images not displayed]

FINDINGS: The lungs are mildly hyperinflated and clear. The heart and
pulmonary vascularity are normal. The mediastinum is normal in
width. There is no pleural effusion. The bony thorax exhibits no
acute abnormality.
IMPRESSION: There is no active cardiopulmonary disease.

## 2018-10-02 DIAGNOSIS — D500 Iron deficiency anemia secondary to blood loss (chronic): Secondary | ICD-10-CM | POA: Diagnosis not present

## 2018-10-02 DIAGNOSIS — I32 Pericarditis in diseases classified elsewhere: Secondary | ICD-10-CM | POA: Diagnosis not present

## 2018-10-02 DIAGNOSIS — D649 Anemia, unspecified: Secondary | ICD-10-CM | POA: Diagnosis not present

## 2018-10-02 DIAGNOSIS — R002 Palpitations: Secondary | ICD-10-CM | POA: Diagnosis not present

## 2018-10-29 DIAGNOSIS — K219 Gastro-esophageal reflux disease without esophagitis: Secondary | ICD-10-CM | POA: Diagnosis not present

## 2018-10-29 DIAGNOSIS — J45909 Unspecified asthma, uncomplicated: Secondary | ICD-10-CM | POA: Diagnosis not present

## 2018-10-29 DIAGNOSIS — R011 Cardiac murmur, unspecified: Secondary | ICD-10-CM | POA: Diagnosis not present

## 2018-10-29 DIAGNOSIS — D649 Anemia, unspecified: Secondary | ICD-10-CM | POA: Diagnosis not present

## 2018-10-29 DIAGNOSIS — E7800 Pure hypercholesterolemia, unspecified: Secondary | ICD-10-CM | POA: Diagnosis not present

## 2018-12-31 DIAGNOSIS — D500 Iron deficiency anemia secondary to blood loss (chronic): Secondary | ICD-10-CM | POA: Diagnosis not present

## 2018-12-31 DIAGNOSIS — R002 Palpitations: Secondary | ICD-10-CM | POA: Diagnosis not present

## 2018-12-31 DIAGNOSIS — D649 Anemia, unspecified: Secondary | ICD-10-CM | POA: Diagnosis not present

## 2018-12-31 DIAGNOSIS — I10 Essential (primary) hypertension: Secondary | ICD-10-CM | POA: Diagnosis not present

## 2019-01-25 DIAGNOSIS — Z1159 Encounter for screening for other viral diseases: Secondary | ICD-10-CM | POA: Diagnosis not present

## 2019-02-14 DIAGNOSIS — D649 Anemia, unspecified: Secondary | ICD-10-CM | POA: Diagnosis not present

## 2019-02-14 DIAGNOSIS — K219 Gastro-esophageal reflux disease without esophagitis: Secondary | ICD-10-CM | POA: Diagnosis not present

## 2019-02-14 DIAGNOSIS — J45909 Unspecified asthma, uncomplicated: Secondary | ICD-10-CM | POA: Diagnosis not present

## 2019-02-14 DIAGNOSIS — U071 COVID-19: Secondary | ICD-10-CM | POA: Diagnosis not present

## 2019-02-24 ENCOUNTER — Other Ambulatory Visit: Payer: Self-pay

## 2019-02-24 ENCOUNTER — Emergency Department
Admission: EM | Admit: 2019-02-24 | Discharge: 2019-02-25 | Disposition: A | Discharge status: Home / Self Care | Payer: BLUE CROSS/BLUE SHIELD | Attending: Emergency Medicine | Admitting: Emergency Medicine

## 2019-02-24 ENCOUNTER — Emergency Department: Payer: BLUE CROSS/BLUE SHIELD

## 2019-02-24 ENCOUNTER — Encounter: Payer: Self-pay | Admitting: Emergency Medicine

## 2019-02-24 DIAGNOSIS — I10 Essential (primary) hypertension: Secondary | ICD-10-CM | POA: Insufficient documentation

## 2019-02-24 DIAGNOSIS — R05 Cough: Secondary | ICD-10-CM | POA: Insufficient documentation

## 2019-02-24 DIAGNOSIS — R0602 Shortness of breath: Secondary | ICD-10-CM | POA: Diagnosis not present

## 2019-02-24 DIAGNOSIS — R531 Weakness: Secondary | ICD-10-CM | POA: Diagnosis not present

## 2019-02-24 DIAGNOSIS — Z79899 Other long term (current) drug therapy: Secondary | ICD-10-CM | POA: Insufficient documentation

## 2019-02-24 DIAGNOSIS — J45909 Unspecified asthma, uncomplicated: Secondary | ICD-10-CM | POA: Insufficient documentation

## 2019-02-24 DIAGNOSIS — U071 COVID-19: Secondary | ICD-10-CM | POA: Diagnosis not present

## 2019-02-24 LAB — COMPREHENSIVE METABOLIC PANEL
ALT: 10 U/L (ref 0–44)
AST: 22 U/L (ref 15–41)
Albumin: 4.8 g/dL (ref 3.5–5.0)
Alkaline Phosphatase: 119 U/L (ref 38–126)
Anion gap: 10 (ref 5–15)
BUN: 10 mg/dL (ref 6–20)
CO2: 26 mmol/L (ref 22–32)
Calcium: 9.8 mg/dL (ref 8.9–10.3)
Chloride: 105 mmol/L (ref 98–111)
Creatinine, Ser: 0.75 mg/dL (ref 0.44–1.00)
GFR calc Af Amer: >60 mL/min (ref >60)
GFR calc non Af Amer: >60 mL/min (ref >60)
Glucose, Bld: 100 mg/dL — ABNORMAL HIGH (ref 70–99)
Potassium: 3.8 mmol/L (ref 3.5–5.1)
Sodium: 141 mmol/L (ref 135–145)
Total Bilirubin: 0.4 mg/dL (ref 0.3–1.2)
Total Protein: 8.6 g/dL — ABNORMAL HIGH (ref 6.5–8.1)

## 2019-02-24 LAB — TROPONIN I: Troponin I: <0.03 ng/mL (ref <0.03)

## 2019-02-24 LAB — CBC
HCT: 34.9 % — ABNORMAL LOW (ref 36.0–46.0)
Hemoglobin: 11.4 g/dL — ABNORMAL LOW (ref 12.0–15.0)
MCH: 27.5 pg (ref 26.0–34.0)
MCHC: 32.7 g/dL (ref 30.0–36.0)
MCV: 84.1 fL (ref 80.0–100.0)
Platelets: 243 10*3/uL (ref 150–400)
RBC: 4.15 MIL/uL (ref 3.87–5.11)
RDW: 12.8 % (ref 11.5–15.5)
WBC: 7.3 10*3/uL (ref 4.0–10.5)
nRBC: 0 % (ref 0.0–0.2)

## 2019-02-24 MED ORDER — SODIUM CHLORIDE 0.9 % IV BOLUS
1000.0000 mL | Freq: Once | INTRAVENOUS | Status: AC
  Administered 2019-02-24: 22:00:00 1000 mL via INTRAVENOUS

## 2019-02-24 MED ORDER — GUAIFENESIN-CODEINE 100-10 MG/5ML PO SOLN: 5.0000 mL | Freq: Four times a day (QID) | ORAL | 0 refills | Status: DC | PRN

## 2019-02-24 NOTE — ED Triage Notes (Signed)
Patient states that she was diagnosed with covid on May 2 and that her symptoms have been getting worse.

## 2019-02-24 NOTE — ED Provider Notes (Signed)
[facility] Emergency Department Provider Note  Time seen: 9:34 PM  I have reviewed the triage vital signs and the nursing notes.   HISTORY  Chief Complaint Weakness and Cough   HPI Ashley Leon is a 43 y.o. female the past medical history of asthma, hypertension, presents to the emergency department for generalized fatigue and weakness.  According to the patient she works at [employer] [facility], was diagnosed with coronavirus on May 2.  Patient states ever since she has felt very weak and fatigued, states she is just not been feeling well so she came to the emergency department for evaluation.  Patient states she has had intermittent cough congestion and chest discomfort over the past 3 weeks.  However her symptoms are resolving besides feeling fatigued and weak.  Patient states she gets tired with minimal exertion.  Denies any fevers recently.  Denies any abdominal pain nausea vomiting or diarrhea.   Past Medical History:  Diagnosis Date  . Asthma   . Hypertension   . Mitral prolapse     Patient Active Problem List   Diagnosis Date Noted  . MVP (mitral valve prolapse) 05/30/2017    History reviewed. No pertinent surgical history.  Prior to Admission medications   Medication Sig Start Date End Date Taking? Authorizing Provider  amLODipine (NORVASC) 2.5 MG tablet  04/02/17   [provider]  BREO ELLIPTA 100-25 MCG/INH AEPB  05/11/17   [provider]  metoprolol tartrate (LOPRESSOR) 50 MG tablet Take 50 mg by mouth.    [provider]  norethindrone-ethinyl estradiol (JUNEL FE 1/20) 1-20 MG-MCG tablet Take 1 tablet by mouth daily. 05/30/17   Tresea MallGledhill, Jane, CNM  pantoprazole (PROTONIX) 40 MG tablet  05/11/17   [provider]  PROAIR HFA 108 [phone](90 Base) MCG/ACT inhaler  05/11/17   [provider]  SUMAtriptan (IMITREX) 100 MG tablet  05/11/17   [provider]    No Known Allergies  No  family history on file.  Social History Social History   Tobacco Use  . Smoking status: Never Smoker  . Smokeless tobacco: Never Used  Substance Use Topics  . Alcohol use: No  . Drug use: No    Review of Systems Constitutional: Negative for fever.  Positive for generalized fatigue/weakness ENT: Negative for recent illness/congestion Cardiovascular: Intermittent chest pain Respiratory: Intermittent shortness of breath and cough. Gastrointestinal: Negative for abdominal pain, vomiting Genitourinary: Negative for urinary compaints Musculoskeletal: Negative for musculoskeletal complaints Skin: Negative for skin complaints  Neurological: Negative for headache All other ROS negative  ____________________________________________   PHYSICAL EXAM:  VITAL SIGNS: ED Triage Vitals  Enc Vitals Group     BP 02/24/19 2117 (!) 198/97     Pulse Rate 02/24/19 2117 (!) 115     Resp 02/24/19 2117 18     Temp 02/24/19 2128 98.7 F (37.1 C)     Temp Source 02/24/19 2128 Oral     SpO2 02/24/19 2117 99 %     Weight 02/24/19 2117 180 lb (81.6 kg)     Height 02/24/19 2117 5\' 8"  (1.727 m)     Head Circumference --      Peak Flow --      Pain Score 02/24/19 2117 7     Pain Loc --      Pain Edu? --      Excl. in GC? --     Constitutional: Alert and oriented. Well appearing and in no distress. Eyes:  Normal exam ENT      Head: Normocephalic and atraumatic.      Mouth/Throat: Mucous membranes are moist. Cardiovascular: Normal rate, regular rhythm around 100 bpm. Respiratory: Normal respiratory effort without tachypnea nor retractions. Breath sounds are clear Gastrointestinal: Soft and nontender. No distention.  Musculoskeletal: Nontender with normal range of motion in all extremities.  Neurologic:  Normal speech and language. No gross focal neurologic deficits  Skin:  Skin is warm, dry and intact.  Psychiatric: Mood and affect are normal.    ____________________________________________   RADIOLOGY  Chest x-ray is negative  ____________________________________________   INITIAL IMPRESSION / ASSESSMENT AND PLAN / ED COURSE  Pertinent labs & imaging results that were available during my care of the patient were reviewed by me and considered in my medical decision making (see chart for details).   Patient presents to the emergency department for continued weakness and fatigue since her diagnosis of coronavirus May 2.  Patient states she has had intermittent chest pain and shortness of breath.  Reassuringly patient satting 99 to 100% on room air currently.  Heart rate around 100 bpm and she is afebrile.  We will check labs, hydrate and closely monitor.  Patient agreeable to plan of care.  Differential would include electrolyte/metabolic abnormality, ACS, COVID-19  Chest x-ray is negative.  Labs are within normal limits.  Negative troponin.  Overall the patient appears well we will discharge home with continued supportive care.  Ashley Leon was evaluated in Emergency Department on 02/24/2019 for the symptoms described in the history of present illness. She was evaluated in the context of the global COVID-19 pandemic, which necessitated consideration that the patient might be at risk for infection with the SARS-CoV-2 virus that causes COVID-19. Institutional protocols and algorithms that pertain to the evaluation of patients at risk for COVID-19 are in a state of rapid change based on information released by regulatory bodies including the CDC and federal and state organizations. These policies and algorithms were followed during the patient's care in the ED.  ____________________________________________   FINAL CLINICAL IMPRESSION(S) / ED DIAGNOSES  Generalized weakness COVID-19   Minna Antis, MD 02/24/19 2315

## 2019-02-25 NOTE — ED Notes (Signed)
Peripheral IV discontinued. Catheter intact. No signs of infiltration or redness. Gauze applied to IV site.   Discharge instructions reviewed with patient. Questions fielded by this RN. Patient verbalizes understanding of instructions. Patient discharged home in stable condition per paduchowski. No acute distress noted at time of discharge.   

## 2019-03-07 DIAGNOSIS — D649 Anemia, unspecified: Secondary | ICD-10-CM | POA: Diagnosis not present

## 2019-03-07 DIAGNOSIS — R002 Palpitations: Secondary | ICD-10-CM | POA: Diagnosis not present

## 2019-03-07 DIAGNOSIS — D500 Iron deficiency anemia secondary to blood loss (chronic): Secondary | ICD-10-CM | POA: Diagnosis not present

## 2019-03-07 DIAGNOSIS — I32 Pericarditis in diseases classified elsewhere: Secondary | ICD-10-CM | POA: Diagnosis not present

## 2019-04-01 DIAGNOSIS — I10 Essential (primary) hypertension: Secondary | ICD-10-CM | POA: Diagnosis not present

## 2019-04-01 DIAGNOSIS — D500 Iron deficiency anemia secondary to blood loss (chronic): Secondary | ICD-10-CM | POA: Diagnosis not present

## 2019-04-01 DIAGNOSIS — I471 Supraventricular tachycardia: Secondary | ICD-10-CM | POA: Diagnosis not present

## 2019-06-04 DIAGNOSIS — E669 Obesity, unspecified: Secondary | ICD-10-CM | POA: Diagnosis not present

## 2019-06-04 DIAGNOSIS — I32 Pericarditis in diseases classified elsewhere: Secondary | ICD-10-CM | POA: Diagnosis not present

## 2019-06-04 DIAGNOSIS — R002 Palpitations: Secondary | ICD-10-CM | POA: Diagnosis not present

## 2019-06-04 DIAGNOSIS — D649 Anemia, unspecified: Secondary | ICD-10-CM | POA: Diagnosis not present

## 2019-06-07 DIAGNOSIS — R5383 Other fatigue: Secondary | ICD-10-CM | POA: Diagnosis not present

## 2019-06-07 DIAGNOSIS — E785 Hyperlipidemia, unspecified: Secondary | ICD-10-CM | POA: Diagnosis not present

## 2019-06-07 DIAGNOSIS — J45909 Unspecified asthma, uncomplicated: Secondary | ICD-10-CM | POA: Diagnosis not present

## 2019-06-07 DIAGNOSIS — K219 Gastro-esophageal reflux disease without esophagitis: Secondary | ICD-10-CM | POA: Diagnosis not present

## 2019-07-04 DIAGNOSIS — I10 Essential (primary) hypertension: Secondary | ICD-10-CM | POA: Diagnosis not present

## 2019-07-04 DIAGNOSIS — E669 Obesity, unspecified: Secondary | ICD-10-CM | POA: Diagnosis not present

## 2019-07-04 DIAGNOSIS — D500 Iron deficiency anemia secondary to blood loss (chronic): Secondary | ICD-10-CM | POA: Diagnosis not present

## 2019-07-04 DIAGNOSIS — R002 Palpitations: Secondary | ICD-10-CM | POA: Diagnosis not present

## 2019-07-07 IMAGING — CR DG CHEST 2V
1 series · 2 of 2 positions shown · non-contrast
Comparison: 06/05/2017

CLINICAL DATA: Patient states no injury, left side chest pain since
[REDACTED]. History of mitral valve prolapse, asthma, HTN.

EXAM:
CHEST - 2 VIEW

[Series 1: dg chest 2 view · 0.14mm/px · 2 of 2 slices shown]
[im 1/2]
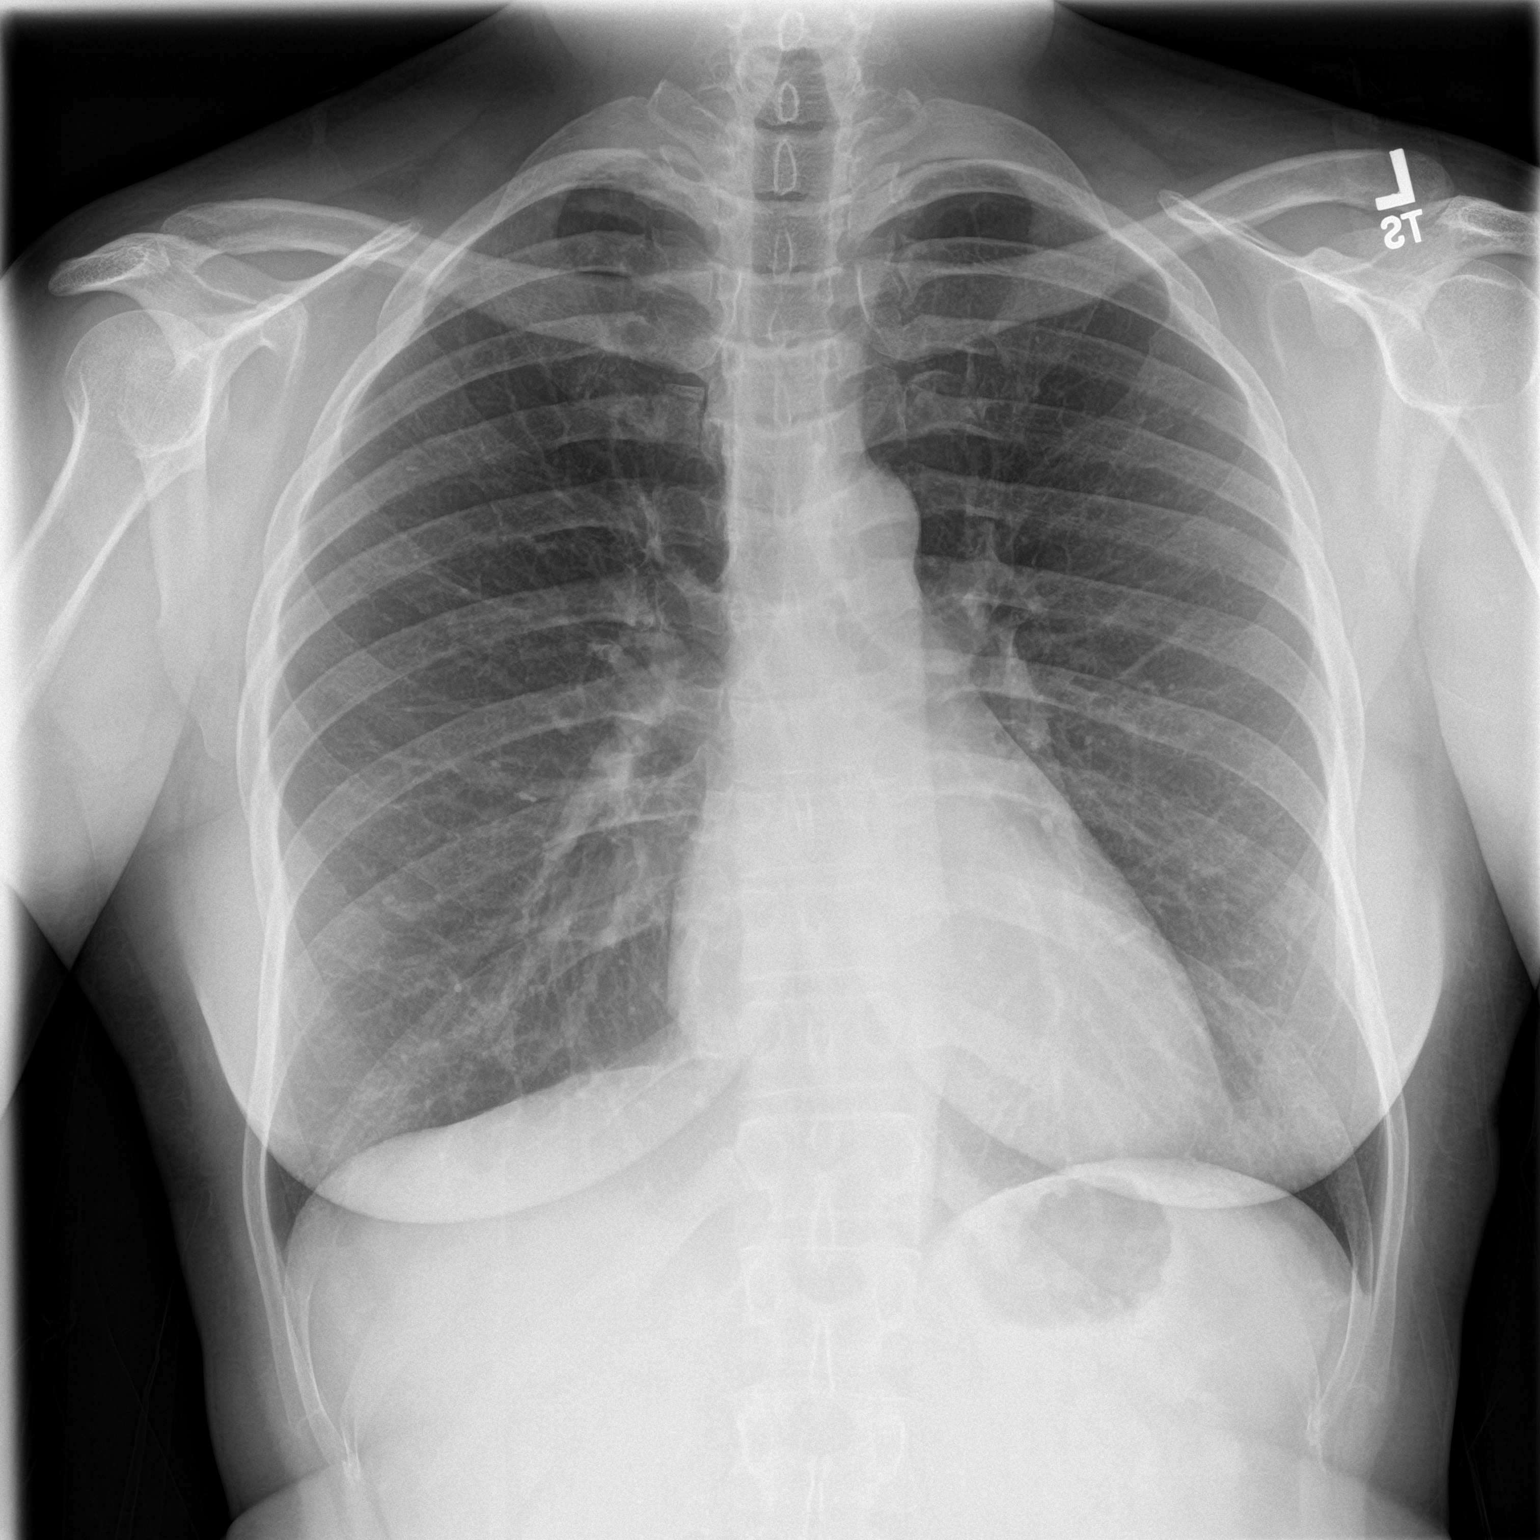
[im 2/2]
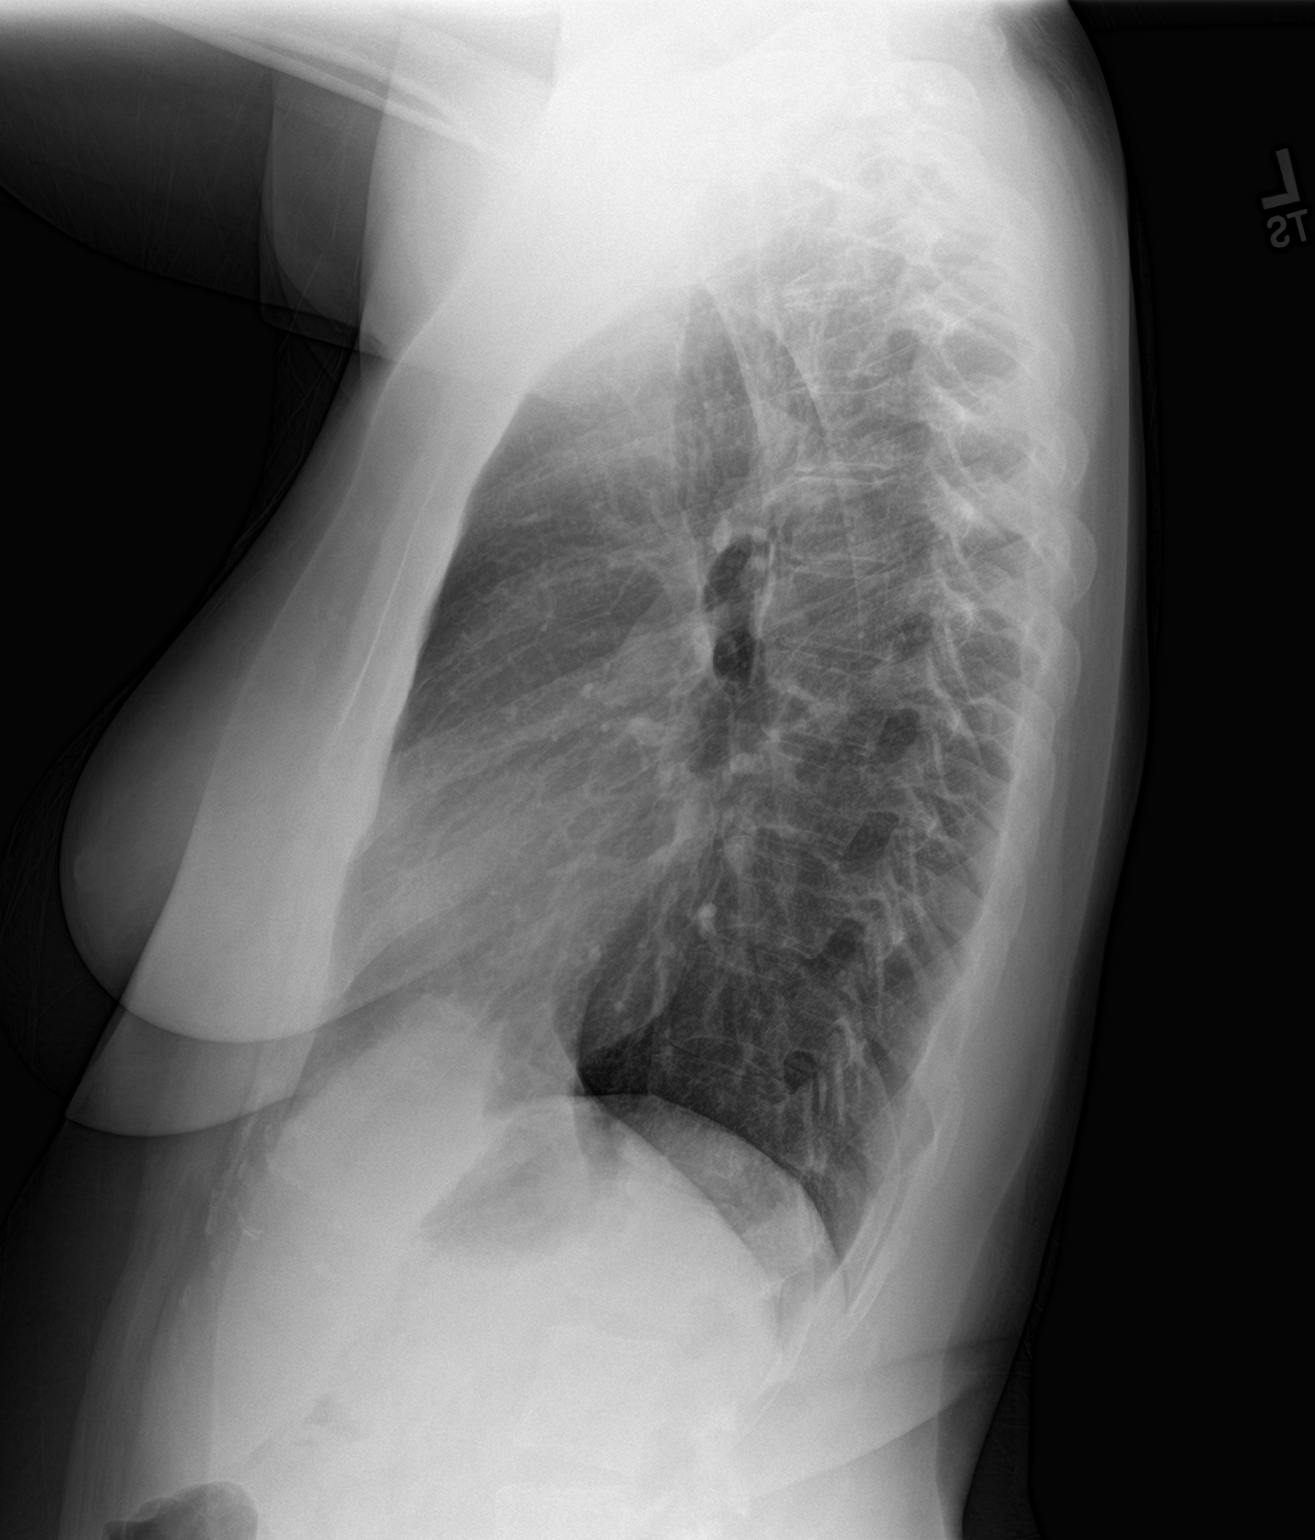

[2 of 2 positions shown; findings below may reference images not displayed]

FINDINGS: The heart size and mediastinal contours are within normal limits.
Both lungs are clear. The visualized skeletal structures are
unremarkable.
IMPRESSION: No active cardiopulmonary disease.

## 2019-07-11 DIAGNOSIS — K219 Gastro-esophageal reflux disease without esophagitis: Secondary | ICD-10-CM | POA: Diagnosis not present

## 2019-07-11 DIAGNOSIS — J45909 Unspecified asthma, uncomplicated: Secondary | ICD-10-CM | POA: Diagnosis not present

## 2019-07-11 DIAGNOSIS — R011 Cardiac murmur, unspecified: Secondary | ICD-10-CM | POA: Diagnosis not present

## 2019-07-11 DIAGNOSIS — G43909 Migraine, unspecified, not intractable, without status migrainosus: Secondary | ICD-10-CM | POA: Diagnosis not present

## 2019-09-23 DIAGNOSIS — D649 Anemia, unspecified: Secondary | ICD-10-CM | POA: Diagnosis not present

## 2019-09-23 DIAGNOSIS — R011 Cardiac murmur, unspecified: Secondary | ICD-10-CM | POA: Diagnosis not present

## 2019-09-23 DIAGNOSIS — E785 Hyperlipidemia, unspecified: Secondary | ICD-10-CM | POA: Diagnosis not present

## 2019-09-23 DIAGNOSIS — J45909 Unspecified asthma, uncomplicated: Secondary | ICD-10-CM | POA: Diagnosis not present

## 2019-09-23 DIAGNOSIS — K219 Gastro-esophageal reflux disease without esophagitis: Secondary | ICD-10-CM | POA: Diagnosis not present

## 2019-09-23 DIAGNOSIS — R5383 Other fatigue: Secondary | ICD-10-CM | POA: Diagnosis not present

## 2019-10-03 DIAGNOSIS — D649 Anemia, unspecified: Secondary | ICD-10-CM | POA: Diagnosis not present

## 2019-10-03 DIAGNOSIS — I32 Pericarditis in diseases classified elsewhere: Secondary | ICD-10-CM | POA: Diagnosis not present

## 2019-10-03 DIAGNOSIS — R002 Palpitations: Secondary | ICD-10-CM | POA: Diagnosis not present

## 2019-10-03 DIAGNOSIS — Z888 Allergy status to other drugs, medicaments and biological substances status: Secondary | ICD-10-CM | POA: Diagnosis not present

## 2019-12-23 DIAGNOSIS — J45909 Unspecified asthma, uncomplicated: Secondary | ICD-10-CM | POA: Diagnosis not present

## 2019-12-23 DIAGNOSIS — E785 Hyperlipidemia, unspecified: Secondary | ICD-10-CM | POA: Diagnosis not present

## 2019-12-23 DIAGNOSIS — R5383 Other fatigue: Secondary | ICD-10-CM | POA: Diagnosis not present

## 2019-12-23 DIAGNOSIS — K219 Gastro-esophageal reflux disease without esophagitis: Secondary | ICD-10-CM | POA: Diagnosis not present

## 2019-12-23 DIAGNOSIS — D649 Anemia, unspecified: Secondary | ICD-10-CM | POA: Diagnosis not present

## 2019-12-23 DIAGNOSIS — R011 Cardiac murmur, unspecified: Secondary | ICD-10-CM | POA: Diagnosis not present

## 2019-12-24 DIAGNOSIS — R079 Chest pain, unspecified: Secondary | ICD-10-CM | POA: Diagnosis not present

## 2019-12-24 DIAGNOSIS — Z888 Allergy status to other drugs, medicaments and biological substances status: Secondary | ICD-10-CM | POA: Diagnosis not present

## 2019-12-24 DIAGNOSIS — I32 Pericarditis in diseases classified elsewhere: Secondary | ICD-10-CM | POA: Diagnosis not present

## 2019-12-24 DIAGNOSIS — D649 Anemia, unspecified: Secondary | ICD-10-CM | POA: Diagnosis not present

## 2020-03-23 DIAGNOSIS — R5383 Other fatigue: Secondary | ICD-10-CM | POA: Diagnosis not present

## 2020-03-23 DIAGNOSIS — R011 Cardiac murmur, unspecified: Secondary | ICD-10-CM | POA: Diagnosis not present

## 2020-03-23 DIAGNOSIS — E785 Hyperlipidemia, unspecified: Secondary | ICD-10-CM | POA: Diagnosis not present

## 2020-03-23 DIAGNOSIS — D649 Anemia, unspecified: Secondary | ICD-10-CM | POA: Diagnosis not present

## 2020-03-23 DIAGNOSIS — K219 Gastro-esophageal reflux disease without esophagitis: Secondary | ICD-10-CM | POA: Diagnosis not present

## 2020-03-23 DIAGNOSIS — J45909 Unspecified asthma, uncomplicated: Secondary | ICD-10-CM | POA: Diagnosis not present

## 2020-04-02 ENCOUNTER — Encounter: Payer: Self-pay | Admitting: Internal Medicine

## 2020-04-02 ENCOUNTER — Other Ambulatory Visit: Payer: Self-pay

## 2020-04-02 ENCOUNTER — Ambulatory Visit: Payer: BLUE CROSS/BLUE SHIELD | Admitting: Internal Medicine

## 2020-04-02 VITALS — BP 172/93 | HR 81 | Ht 68.0 in | Wt 189.7 lb

## 2020-04-02 DIAGNOSIS — E8881 Metabolic syndrome: Secondary | ICD-10-CM | POA: Diagnosis not present

## 2020-04-02 DIAGNOSIS — I471 Supraventricular tachycardia: Secondary | ICD-10-CM

## 2020-04-02 DIAGNOSIS — I341 Nonrheumatic mitral (valve) prolapse: Secondary | ICD-10-CM | POA: Diagnosis not present

## 2020-04-02 DIAGNOSIS — I10 Essential (primary) hypertension: Secondary | ICD-10-CM | POA: Diagnosis not present

## 2020-04-02 NOTE — Progress Notes (Signed)
Established Patient Office Visit  SUBJECTIVE:  Subjective  Patient ID: Ashley Leon, female    DOB: July 12, 1976  Age: 44 y.o. MRN: [id number]  CC:  Chief Complaint  Patient presents with  . Hypertension    3 month follow up    HPI Ashley Leon is a 44 y.o. female presenting today for a three month follow up of her hypertension.   She notes that her blood pressure dropped to 113/60. She notes that she was getting light headed. She notes that she felt like "her heart was vibrating." She does not smoke or drink.  Today her blood pressure is 172/93.   Past Medical History:  Diagnosis Date  . Asthma   . Hypertension   . Mitral prolapse     History reviewed. No pertinent surgical history.  History reviewed. No pertinent family history.  Social History   Socioeconomic History  . Marital status: Divorced    Spouse name: Not on file  . Number of children: Not on file  . Years of education: Not on file  . Highest education level: Not on file  Occupational History  . Not on file  Tobacco Use  . Smoking status: Never Smoker  . Smokeless tobacco: Never Used  Substance and Sexual Activity  . Alcohol use: No  . Drug use: No  . Sexual activity: Not on file  Other Topics Concern  . Not on file  Social History Narrative  . Not on file   Social Determinants of Health   Financial Resource Strain:   . Difficulty of Paying Living Expenses:   Food Insecurity:   . Worried About Programme researcher, broadcasting/film/video in the Last Year:   . Barista in the Last Year:   Transportation Needs:   . Freight forwarder (Medical):   Marland Kitchen Lack of Transportation (Non-Medical):   Physical Activity:   . Days of Exercise per Week:   . Minutes of Exercise per Session:   Stress:   . Feeling of Stress :   Social Connections:   . Frequency of Communication with Friends and Family:   . Frequency of Social Gatherings with Friends and Family:   . Attends Religious Services:   . Active  Member of Clubs or Organizations:   . Attends Banker Meetings:   Marland Kitchen Marital Status:   Intimate Partner Violence:   . Fear of Current or Ex-Partner:   . Emotionally Abused:   Marland Kitchen Physically Abused:   . Sexually Abused:      Current Outpatient Medications:  .  amLODipine (NORVASC) 2.5 MG tablet, , Disp: , Rfl:  .  guaiFENesin-codeine 100-10 MG/5ML syrup, Take 5 mLs by mouth every 6 (six) hours as needed for cough., Disp: 120 mL, Rfl: 0 .  metoprolol tartrate (LOPRESSOR) 50 MG tablet, Take 50 mg by mouth., Disp: , Rfl:  .  montelukast (SINGULAIR) 10 MG tablet, Take 10 mg by mouth at bedtime., Disp: , Rfl:  .  norethindrone-ethinyl estradiol (JUNEL FE 1/20) 1-20 MG-MCG tablet, Take 1 tablet by mouth daily., Disp: 1 Package, Rfl: 11 .  pantoprazole (PROTONIX) 40 MG tablet, , Disp: , Rfl:  .  PROAIR HFA 108 (90 Base) MCG/ACT inhaler, , Disp: , Rfl:  .  SUMAtriptan (IMITREX) 100 MG tablet, , Disp: , Rfl:  .  SYMBICORT 160-4.5 MCG/ACT inhaler, Inhale 1 puff into the lungs 2 (two) times daily., Disp: , Rfl:    No Known Allergies  ROS Review of  Systems  Constitutional: Negative.   HENT: Negative.   Eyes: Negative.   Respiratory: Negative.   Cardiovascular: Positive for palpitations.  Gastrointestinal: Negative.   Endocrine: Negative.   Genitourinary: Negative.   Musculoskeletal: Negative.   Skin: Negative.   Allergic/Immunologic: Negative.   Neurological: Positive for light-headedness.  Hematological: Negative.   Psychiatric/Behavioral: Negative.   All other systems reviewed and are negative.    OBJECTIVE:    Physical Exam Vitals reviewed.  Constitutional:      Appearance: Normal appearance.  HENT:     Mouth/Throat:     Mouth: Mucous membranes are moist.  Eyes:     Pupils: Pupils are equal, round, and reactive to light.  Cardiovascular:     Rate and Rhythm: Normal rate and regular rhythm.     Pulses: Normal pulses.     Heart sounds: Normal heart sounds.    Pulmonary:     Effort: Pulmonary effort is normal.     Breath sounds: Normal breath sounds.  Abdominal:     Palpations: There is no hepatomegaly, splenomegaly or mass.     Tenderness: There is no abdominal tenderness.  Musculoskeletal:     Right lower leg: No edema.     Left lower leg: No edema.  Neurological:     Mental Status: She is alert and oriented to person, place, and time.  Psychiatric:        Mood and Affect: Mood and affect normal.        Behavior: Behavior normal.     BP (!) 172/93   Pulse 81   Ht 5\' 8"  (1.727 m)   Wt 189 lb 11.2 oz (86 kg)   BMI 28.84 kg/m  Wt Readings from Last 3 Encounters:  04/02/20 189 lb 11.2 oz (86 kg)  02/24/19 180 lb (81.6 kg)  05/30/17 192 lb (87.1 kg)    Health Maintenance Due  Topic Date Due  . Hepatitis C Screening  Never done  . HIV Screening  Never done  . TETANUS/TDAP  Never done  . PAP SMEAR-Modifier  05/30/2020    There are no preventive care reminders to display for this patient.  CBC Latest Ref Rng & Units 02/24/2019 01/29/2016  WBC 4.0 - 10.5 K/uL 7.3 5.0  Hemoglobin 12.0 - 15.0 g/dL 11.4(L) 11.6(L)  Hematocrit 36 - 46 % 34.9(L) 35.2  Platelets 150 - 400 K/uL 243 259   CMP Latest Ref Rng & Units 02/24/2019 01/29/2016  Glucose 70 - 99 mg/dL 03/30/2016) 99  BUN 6 - 20 mg/dL 10 12  Creatinine 222(L - 1.00 mg/dL 7.98 9.21  Sodium 1.94 - 145 mmol/L 141 141  Potassium 3.5 - 5.1 mmol/L 3.8 4.4  Chloride 98 - 111 mmol/L 105 105  CO2 22 - 32 mmol/L 26 27  Calcium 8.9 - 10.3 mg/dL 9.8 9.8  Total Protein 6.5 - 8.1 g/dL 174) -  Total Bilirubin 0.3 - 1.2 mg/dL 0.4 -  Alkaline Phos 38 - 126 U/L 119 -  AST 15 - 41 U/L 22 -  ALT 0 - 44 U/L 10 -    No results found for: TSH Lab Results  Component Value Date   ALBUMIN 4.8 02/24/2019   ANIONGAP 10 02/24/2019   No results found for: CHOL, HDL, LDLCALC, CHOLHDL No results found for: TRIG No results found for: HGBA1C    ASSESSMENT & PLAN:   Problem List Items Addressed This  Visit      Cardiovascular and Mediastinum   MVP (mitral valve prolapse) -  Primary    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  She has been advised to drink a lot of water.  Was advised to lose weight.  Also advised not to drink much coffee.      Hypertension    - Today, the patient's blood pressure is well managed on   betablockers . - The patient will continue the current treatment regimen.  - I encouraged the patient to eat a low-sodium diet to help control blood pressure. - I encouraged the patient to live an active lifestyle and complete activities that increases heart rate to 85% target heart rate at least 5 times per week for one hour.          SVT (supraventricular tachycardia) (HCC)    Beta-blockers are controlling her tachycardia.        Other   Metabolic syndrome    - I encouraged the patient to lose weight.  - I educated them on making healthy dietary choices including eating more fruits and vegetables and less fried foods. - I encouraged the patient to exercise more, and educated on the benefits of exercise including weight loss, diabetes prevention, and hypertension prevention.           No orders of the defined types were placed in this encounter.   Follow-up: Return in about 3 months (around 07/03/2020) for HTN FU.    Dr. Woodroe Chen [facility] [address], [place], [place] [address]   By signing my name below, I, [employer], attest that this documentation has been prepared under the direction and in the presence of Corky Downs, MD. Electronically Signed: Corky Downs, MD 04/04/20, 2:54 PM    I personally performed the services described in this documentation, which was SCRIBED in my presence. The recorded information has been reviewed and considered accurate. It has been edited as necessary during review. Corky Downs, MD

## 2020-04-04 ENCOUNTER — Encounter: Payer: Self-pay | Admitting: Internal Medicine

## 2020-04-04 DIAGNOSIS — I471 Supraventricular tachycardia, unspecified: Secondary | ICD-10-CM | POA: Insufficient documentation

## 2020-04-04 DIAGNOSIS — I10 Essential (primary) hypertension: Secondary | ICD-10-CM | POA: Insufficient documentation

## 2020-04-04 DIAGNOSIS — E8881 Metabolic syndrome: Secondary | ICD-10-CM | POA: Insufficient documentation

## 2020-04-04 NOTE — Assessment & Plan Note (Signed)
-   I encouraged the patient to lose weight.  - I educated them on making healthy dietary choices including eating more fruits and vegetables and less fried foods. - I encouraged the patient to exercise more, and educated on the benefits of exercise including weight loss, diabetes prevention, and hypertension prevention.   

## 2020-04-04 NOTE — Assessment & Plan Note (Signed)
Beta-blockers are controlling her tachycardia.

## 2020-04-04 NOTE — Assessment & Plan Note (Signed)
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  She has been advised to drink a lot of water.  Was advised to lose weight.  Also advised not to drink much coffee.

## 2020-04-04 NOTE — Assessment & Plan Note (Signed)
-   Today, the patient's blood pressure is well managed on betablockers. - The patient will continue the current treatment regimen.  - I encouraged the patient to eat a low-sodium diet to help control blood pressure. - I encouraged the patient to live an active lifestyle and complete activities that increases heart rate to 85% target heart rate at least 5 times per week for one hour.     

## 2020-05-16 IMAGING — DX PORTABLE CHEST - 1 VIEW
1 series · 1 of 1 positions shown · non-contrast
Comparison: 04/16/2018

CLINICAL DATA: Weakness.  [id number] positive

EXAM:
PORTABLE CHEST 1 VIEW

[chest ap]
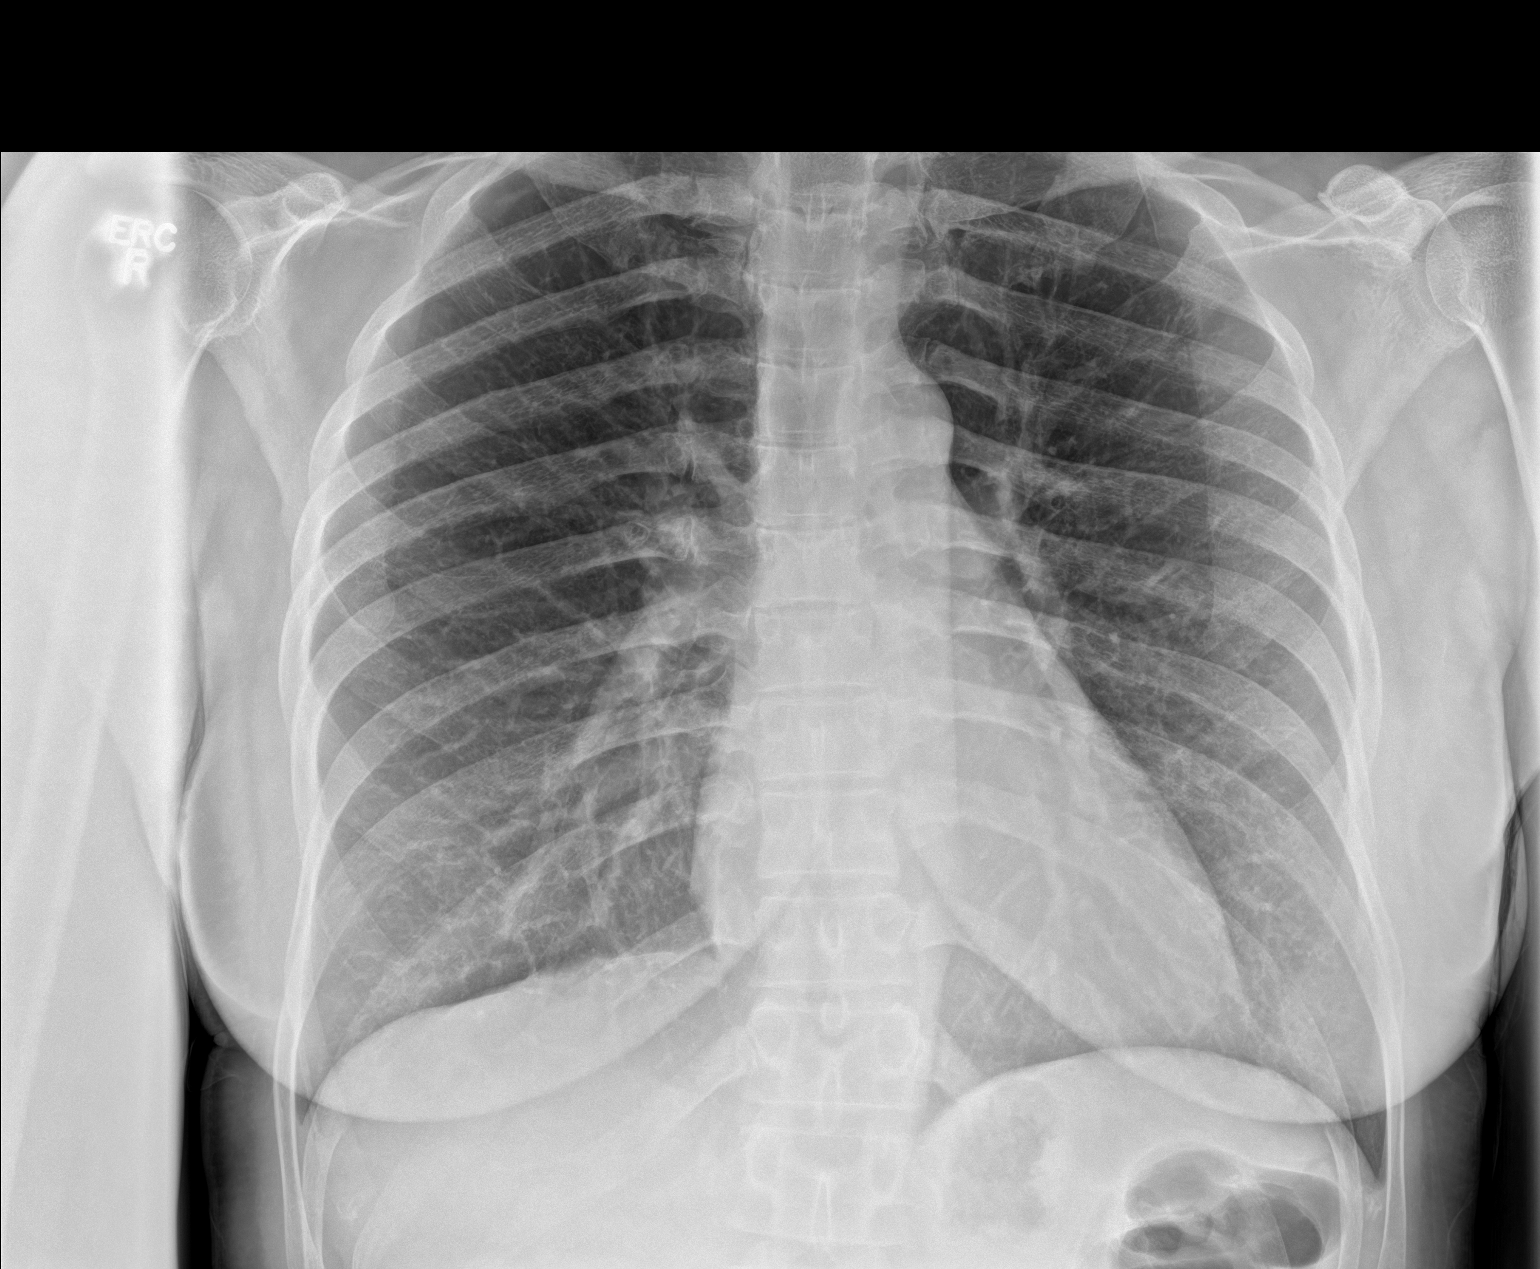

[1 of 1 positions shown; findings below may reference images not displayed]

FINDINGS: The heart size and mediastinal contours are within normal limits.
Both lungs are clear. The visualized skeletal structures are
unremarkable.
IMPRESSION: Negative chest.

## 2020-06-08 ENCOUNTER — Other Ambulatory Visit: Payer: Self-pay | Admitting: Internal Medicine

## 2020-06-22 DIAGNOSIS — E559 Vitamin D deficiency, unspecified: Secondary | ICD-10-CM | POA: Diagnosis not present

## 2020-06-22 DIAGNOSIS — Z79899 Other long term (current) drug therapy: Secondary | ICD-10-CM | POA: Diagnosis not present

## 2020-06-22 DIAGNOSIS — E785 Hyperlipidemia, unspecified: Secondary | ICD-10-CM | POA: Diagnosis not present

## 2020-06-22 DIAGNOSIS — K219 Gastro-esophageal reflux disease without esophagitis: Secondary | ICD-10-CM | POA: Diagnosis not present

## 2020-06-22 DIAGNOSIS — J45909 Unspecified asthma, uncomplicated: Secondary | ICD-10-CM | POA: Diagnosis not present

## 2020-07-03 ENCOUNTER — Other Ambulatory Visit: Payer: Self-pay

## 2020-07-03 ENCOUNTER — Ambulatory Visit (INDEPENDENT_AMBULATORY_CARE_PROVIDER_SITE_OTHER): Payer: BC Managed Care – PPO | Admitting: Family Medicine

## 2020-07-03 ENCOUNTER — Encounter: Payer: Self-pay | Admitting: Family Medicine

## 2020-07-03 VITALS — BP 188/88 | HR 69 | Ht 68.0 in | Wt 197.7 lb

## 2020-07-03 DIAGNOSIS — M26609 Unspecified temporomandibular joint disorder, unspecified side: Secondary | ICD-10-CM | POA: Insufficient documentation

## 2020-07-03 DIAGNOSIS — I10 Essential (primary) hypertension: Secondary | ICD-10-CM

## 2020-07-03 DIAGNOSIS — I341 Nonrheumatic mitral (valve) prolapse: Secondary | ICD-10-CM | POA: Diagnosis not present

## 2020-07-03 MED ORDER — HYDROCHLOROTHIAZIDE 12.5 MG PO CAPS: 12.5000 mg | ORAL_CAPSULE | Freq: Every day | ORAL | 3 refills | Status: DC

## 2020-07-03 NOTE — Progress Notes (Addendum)
Established Patient Office Visit  SUBJECTIVE:  Subjective  Patient ID: Ashley Leon, female    DOB: 02-21-1976  Age: 44 y.o. MRN: [id number]  CC:  Chief Complaint  Patient presents with  . Hypertension    3 month routine follow up  . Temporomandibular Joint Pain    Patient complains of jaw pain when eating    HPI Ashley Leon is a 44 y.o. female presenting today for a hypertension follow up and evaluation of jaw pain.  Her blood pressure today is 177/79. She continues to take her medication as directed. She notes that she starts to feel a little woozy when she takes her medication.   She notes that she has been having significant jaw pain on her left side. This originally happened in June and she was diagnosed with TMJ. Her symptoms lessened to a tolerable level, but recently started to flair up again. She has difficulty chewing and opening/closing her jaw in general.    Past Medical History:  Diagnosis Date  . Asthma   . Hypertension   . Mitral prolapse     History reviewed. No pertinent surgical history.  History reviewed. No pertinent family history.  Social History   Socioeconomic History  . Marital status: Divorced    Spouse name: Not on file  . Number of children: Not on file  . Years of education: Not on file  . Highest education level: Not on file  Occupational History  . Not on file  Tobacco Use  . Smoking status: Never Smoker  . Smokeless tobacco: Never Used  Substance and Sexual Activity  . Alcohol use: No  . Drug use: No  . Sexual activity: Not on file  Other Topics Concern  . Not on file  Social History Narrative  . Not on file   Social Determinants of Health   Financial Resource Strain:   . Difficulty of Paying Living Expenses: Not on file  Food Insecurity:   . Worried About Programme researcher, broadcasting/film/video in the Last Year: Not on file  . Ran Out of Food in the Last Year: Not on file  Transportation Needs:   . Lack of Transportation  (Medical): Not on file  . Lack of Transportation (Non-Medical): Not on file  Physical Activity:   . Days of Exercise per Week: Not on file  . Minutes of Exercise per Session: Not on file  Stress:   . Feeling of Stress : Not on file  Social Connections:   . Frequency of Communication with Friends and Family: Not on file  . Frequency of Social Gatherings with Friends and Family: Not on file  . Attends Religious Services: Not on file  . Active Member of Clubs or Organizations: Not on file  . Attends Banker Meetings: Not on file  . Marital Status: Not on file  Intimate Partner Violence:   . Fear of Current or Ex-Partner: Not on file  . Emotionally Abused: Not on file  . Physically Abused: Not on file  . Sexually Abused: Not on file     Current Outpatient Medications:  .  amLODipine (NORVASC) 2.5 MG tablet, , Disp: , Rfl:  .  hydrochlorothiazide (MICROZIDE) 12.5 MG capsule, Take 1 capsule (12.5 mg total) by mouth daily., Disp: 30 capsule, Rfl: 3 .  metoprolol tartrate (LOPRESSOR) 50 MG tablet, Take 50 mg by mouth., Disp: , Rfl:  .  montelukast (SINGULAIR) 10 MG tablet, Take 10 mg by mouth at bedtime., Disp: ,  Rfl:  .  pantoprazole (PROTONIX) 40 MG tablet, , Disp: , Rfl:  .  PROAIR HFA 108 (90 Base) MCG/ACT inhaler, , Disp: , Rfl:  .  SUMAtriptan (IMITREX) 100 MG tablet, , Disp: , Rfl:  .  SYMBICORT 160-4.5 MCG/ACT inhaler, Inhale 1 puff into the lungs 2 (two) times daily., Disp: , Rfl:    No Known Allergies  ROS Review of Systems  Constitutional: Negative.   HENT: Negative.   Eyes: Negative.   Respiratory: Negative.   Cardiovascular: Negative.   Gastrointestinal: Negative.   Endocrine: Negative.   Genitourinary: Negative.   Musculoskeletal: Negative.        +TMJ L sided  Skin: Negative.   Allergic/Immunologic: Negative.   Neurological: Positive for headaches.  Hematological: Negative.   Psychiatric/Behavioral: Negative.   All other systems reviewed and are  negative.    OBJECTIVE:    Physical Exam Vitals reviewed.  Constitutional:      Appearance: Normal appearance.  HENT:     Head:     Jaw: Tenderness (L side) and pain on movement (L side) present. No swelling.     Mouth/Throat:     Mouth: Mucous membranes are moist.  Eyes:     Pupils: Pupils are equal, round, and reactive to light.  Neck:     Vascular: No carotid bruit.  Cardiovascular:     Rate and Rhythm: Normal rate and regular rhythm.     Pulses: Normal pulses.     Heart sounds: Murmur heard.  Systolic murmur is present with a grade of 1/6.   Pulmonary:     Effort: Pulmonary effort is normal.     Breath sounds: Normal breath sounds.  Abdominal:     General: Bowel sounds are normal.     Palpations: Abdomen is soft. There is no hepatomegaly, splenomegaly or mass.     Tenderness: There is no abdominal tenderness.     Hernia: No hernia is present.  Musculoskeletal:        General: No tenderness.     Cervical back: Neck supple.     Right lower leg: No edema.     Left lower leg: No edema.  Skin:    Findings: No rash.  Neurological:     Mental Status: She is alert and oriented to person, place, and time.     Motor: No weakness.  Psychiatric:        Mood and Affect: Mood and affect normal.        Behavior: Behavior normal.     BP (!) 188/88 (BP Location: Left Arm, Patient Position: Sitting)   Pulse 69   Ht 5\' 8"  (1.727 m)   Wt 197 lb 11.2 oz (89.7 kg)   BMI 30.06 kg/m  Wt Readings from Last 3 Encounters:  07/03/20 197 lb 11.2 oz (89.7 kg)  04/02/20 189 lb 11.2 oz (86 kg)  02/24/19 180 lb (81.6 kg)    Health Maintenance Due  Topic Date Due  . Hepatitis C Screening  Never done  . COVID-19 Vaccine (1) Never done  . HIV Screening  Never done  . TETANUS/TDAP  Never done  . INFLUENZA VACCINE  Never done  . PAP SMEAR-Modifier  05/30/2020    There are no preventive care reminders to display for this patient.  CBC Latest Ref Rng & Units 02/24/2019 01/29/2016    WBC 4.0 - 10.5 K/uL 7.3 5.0  Hemoglobin 12.0 - 15.0 g/dL 11.4(L) 11.6(L)  Hematocrit 36 - 46 % 34.9(L) 35.2  Platelets 150 - 400 K/uL 243 259   CMP Latest Ref Rng & Units 02/24/2019 01/29/2016  Glucose 70 - 99 mg/dL 737(T) 99  BUN 6 - 20 mg/dL 10 12  Creatinine 0.62 - 1.00 mg/dL 6.94 8.54  Sodium 627 - 145 mmol/L 141 141  Potassium 3.5 - 5.1 mmol/L 3.8 4.4  Chloride 98 - 111 mmol/L 105 105  CO2 22 - 32 mmol/L 26 27  Calcium 8.9 - 10.3 mg/dL 9.8 9.8  Total Protein 6.5 - 8.1 g/dL 0.3(J) -  Total Bilirubin 0.3 - 1.2 mg/dL 0.4 -  Alkaline Phos 38 - 126 U/L 119 -  AST 15 - 41 U/L 22 -  ALT 0 - 44 U/L 10 -    No results found for: TSH Lab Results  Component Value Date   ALBUMIN 4.8 02/24/2019   ANIONGAP 10 02/24/2019   No results found for: CHOL, HDL, LDLCALC, CHOLHDL No results found for: TRIG No results found for: HGBA1C    ASSESSMENT & PLAN:   Problem List Items Addressed This Visit      Cardiovascular and Mediastinum   MVP (mitral valve prolapse)    1/6 murmur heard at MV, no sob or CP reported.       Relevant Medications   hydrochlorothiazide (MICROZIDE) 12.5 MG capsule   Hypertension - Primary    Patient's blood pressure is not within the desired range.  An office visit is recommended. Medication side effects include: no side effects noted Continue current treatment regimen. Continue current medications. Follow up in 1 month.       Relevant Medications   hydrochlorothiazide (MICROZIDE) 12.5 MG capsule     Musculoskeletal and Integument   TMJ disease    Pt with left sided jaw pain x 5 months after a visit to her dentist. No other trauma reported, h/a and pain with chewing associated. Pt able to open mouth 60% without significant pain, no dislocation of the joint noted. Discussed ref to Maxial facial for evaluation.       Relevant Orders   Ambulatory referral to Oral Maxillofacial Surgery      Meds ordered this encounter  Medications  .  hydrochlorothiazide (MICROZIDE) 12.5 MG capsule    Sig: Take 1 capsule (12.5 mg total) by mouth daily.    Dispense:  30 capsule    Refill:  3    Follow-up: No follow-ups on file.    Irish Lack, FNP [facility] [address], [place], [place] [address]   By signing my name below, I, [employer], attest that this documentation has been prepared under the direction and in the presence of Irish Lack, FNP Electronically Signed: Irish Lack, FNP 07/09/20, 2:03 PM  I personally performed the services described in this documentation, which was SCRIBED in my presence. The recorded information has been reviewed and considered accurate. It has been edited as necessary during review. Irish Lack, FNP

## 2020-07-03 NOTE — Assessment & Plan Note (Signed)
Pt with left sided jaw pain x 5 months after a visit to her dentist. No other trauma reported, h/a and pain with chewing associated. Pt able to open mouth 60% without significant pain, no dislocation of the joint noted. Discussed ref to Maxial facial for evaluation.

## 2020-07-03 NOTE — Assessment & Plan Note (Signed)
Patient's blood pressure is not within the desired range.  An office visit is recommended. Medication side effects include: no side effects noted Continue current treatment regimen. Continue current medications. Follow up in 1 month.

## 2020-07-03 NOTE — Assessment & Plan Note (Signed)
1/6 murmur heard at MV, no sob or CP reported.

## 2020-07-29 ENCOUNTER — Encounter: Payer: Self-pay | Admitting: Advanced Practice Midwife

## 2020-07-29 ENCOUNTER — Other Ambulatory Visit (HOSPITAL_COMMUNITY)
Admission: RE | Admit: 2020-07-29 | Discharge: 2020-07-29 | Disposition: A | Discharge status: Home / Self Care | Payer: BC Managed Care – PPO | Source: Ambulatory Visit | Attending: Advanced Practice Midwife | Admitting: Advanced Practice Midwife

## 2020-07-29 ENCOUNTER — Other Ambulatory Visit: Payer: Self-pay

## 2020-07-29 ENCOUNTER — Ambulatory Visit (INDEPENDENT_AMBULATORY_CARE_PROVIDER_SITE_OTHER): Payer: BC Managed Care – PPO | Admitting: Advanced Practice Midwife

## 2020-07-29 VITALS — BP 126/84 | Ht 68.0 in | Wt 199.0 lb

## 2020-07-29 DIAGNOSIS — Z124 Encounter for screening for malignant neoplasm of cervix: Secondary | ICD-10-CM | POA: Insufficient documentation

## 2020-07-29 DIAGNOSIS — Z01419 Encounter for gynecological examination (general) (routine) without abnormal findings: Secondary | ICD-10-CM | POA: Diagnosis not present

## 2020-07-29 NOTE — Patient Instructions (Signed)
Health Maintenance, Female Adopting a healthy lifestyle and getting preventive care are important in promoting health and wellness. Ask your health care provider about:  The right schedule for you to have regular tests and exams.  Things you can do on your own to prevent diseases and keep yourself healthy. What should I know about diet, weight, and exercise? Eat a healthy diet   Eat a diet that includes plenty of vegetables, fruits, low-fat dairy products, and lean protein.  Do not eat a lot of foods that are high in solid fats, added sugars, or sodium. Maintain a healthy weight Body mass index (BMI) is used to identify weight problems. It estimates body fat based on height and weight. Your health care provider can help determine your BMI and help you achieve or maintain a healthy weight. Get regular exercise Get regular exercise. This is one of the most important things you can do for your health. Most adults should:  Exercise for at least 150 minutes each week. The exercise should increase your heart rate and make you sweat (moderate-intensity exercise).  Do strengthening exercises at least twice a week. This is in addition to the moderate-intensity exercise.  Spend less time sitting. Even light physical activity can be beneficial. Watch cholesterol and blood lipids Have your blood tested for lipids and cholesterol at 46 years of age, then have this test every 5 years. Have your cholesterol levels checked more often if:  Your lipid or cholesterol levels are high.  You are older than 46 years of age.  You are at high risk for heart disease. What should I know about cancer screening? Depending on your health history and family history, you may need to have cancer screening at various ages. This may include screening for:  Breast cancer.  Cervical cancer.  Colorectal cancer.  Skin cancer.  Lung cancer. What should I know about heart disease, diabetes, and high blood  pressure? Blood pressure and heart disease  High blood pressure causes heart disease and increases the risk of stroke. This is more likely to develop in people who have high blood pressure readings, are of African descent, or are overweight.  Have your blood pressure checked: ? Every 3-5 years if you are 18-39 years of age. ? Every year if you are 40 years old or older. Diabetes Have regular diabetes screenings. This checks your fasting blood sugar level. Have the screening done:  Once every three years after age 40 if you are at a normal weight and have a low risk for diabetes.  More often and at a younger age if you are overweight or have a high risk for diabetes. What should I know about preventing infection? Hepatitis B If you have a higher risk for hepatitis B, you should be screened for this virus. Talk with your health care provider to find out if you are at risk for hepatitis B infection. Hepatitis C Testing is recommended for:  Everyone born from 1945 through 1965.  Anyone with known risk factors for hepatitis C. Sexually transmitted infections (STIs)  Get screened for STIs, including gonorrhea and chlamydia, if: ? You are sexually active and are younger than 46 years of age. ? You are older than 46 years of age and your health care provider tells you that you are at risk for this type of infection. ? Your sexual activity has changed since you were last screened, and you are at increased risk for chlamydia or gonorrhea. Ask your health care provider if   you are at risk.  Ask your health care provider about whether you are at high risk for HIV. Your health care provider may recommend a prescription medicine to help prevent HIV infection. If you choose to take medicine to prevent HIV, you should first get tested for HIV. You should then be tested every 3 months for as long as you are taking the medicine. Pregnancy  If you are about to stop having your period (premenopausal) and  you may become pregnant, seek counseling before you get pregnant.  Take 400 to 800 micrograms (mcg) of folic acid every day if you become pregnant.  Ask for birth control (contraception) if you want to prevent pregnancy. Osteoporosis and menopause Osteoporosis is a disease in which the bones lose minerals and strength with aging. This can result in bone fractures. If you are 65 years old or older, or if you are at risk for osteoporosis and fractures, ask your health care provider if you should:  Be screened for bone loss.  Take a calcium or vitamin D supplement to lower your risk of fractures.  Be given hormone replacement therapy (HRT) to treat symptoms of menopause. Follow these instructions at home: Lifestyle  Do not use any products that contain nicotine or tobacco, such as cigarettes, e-cigarettes, and chewing tobacco. If you need help quitting, ask your health care provider.  Do not use street drugs.  Do not share needles.  Ask your health care provider for help if you need support or information about quitting drugs. Alcohol use  Do not drink alcohol if: ? Your health care provider tells you not to drink. ? You are pregnant, may be pregnant, or are planning to become pregnant.  If you drink alcohol: ? Limit how much you use to 0-1 drink a day. ? Limit intake if you are breastfeeding.  Be aware of how much alcohol is in your drink. In the [place] one drink equals one 12 oz bottle of beer (355 mL), one 5 oz glass of wine (148 mL), or one 1 oz glass of hard liquor (44 mL). General instructions  Schedule regular health, dental, and eye exams.  Stay current with your vaccines.  Tell your health care provider if: ? You often feel depressed. ? You have ever been abused or do not feel safe at home. Summary  Adopting a healthy lifestyle and getting preventive care are important in promoting health and wellness.  Follow your health care provider's instructions about healthy  diet, exercising, and getting tested or screened for diseases.  Follow your health care provider's instructions on monitoring your cholesterol and blood pressure. This information is not intended to replace advice given to you by your health care provider. Make sure you discuss any questions you have with your health care provider. Document Revised: 09/05/2018 Document Reviewed: 09/05/2018 Elsevier Patient Education  2020 Elsevier Inc[employer]  

## 2020-07-30 NOTE — Progress Notes (Signed)
Gynecology Annual Exam  Date of Service: 07/29/2020  PCP: Evelene Croon, MD  Chief Complaint:  Chief Complaint  Patient presents with  . Annual Exam    History of Present Illness: Patient is a 44 y.o. G2P1011 presents for annual exam. The patient has no gyn complaints today. She declines referrals for mammogram and colonoscopy today.   LMP: No LMP recorded. (Menstrual status: Irregular Periods). Average Interval: irregular, last period was 9 months ago Duration of flow: 2-3 days Heavy Menses: no  Clots: no Intermenstrual Bleeding: no Postcoital Bleeding: no Dysmenorrhea: no   The patient is sexually active. She currently uses none for contraception. She denies dyspareunia.  The patient does perform self breast exams.  There is no notable family history of breast or ovarian cancer in her family.  The patient wears seatbelts: yes.   The patient has regular exercise: she regularly weight lifts and does squats. She admits a primarily healthy diet and she occasionaly enjoys fried foods. She drinks water and also 2 sodas per day. She admits 7-8 hours of sleep per night.  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  The patient denies current symptoms of depression.    Review of Systems: Review of Systems  Constitutional: Negative for chills and fever.  HENT: Negative for congestion, ear discharge, ear pain, hearing loss, sinus pain and sore throat.   Eyes: Negative for blurred vision and double vision.  Respiratory: Negative for cough, shortness of breath and wheezing.   Cardiovascular: Negative for chest pain, palpitations and leg swelling.  Gastrointestinal: Negative for abdominal pain, blood in stool, constipation, diarrhea, heartburn, melena, nausea and vomiting.  Genitourinary: Negative for dysuria, flank pain, frequency, hematuria and urgency.  Musculoskeletal: Negative for back pain, joint pain and myalgias.  Skin: Negative for itching and rash.    Neurological: Negative for dizziness, tingling, tremors, sensory change, speech change, focal weakness, seizures, loss of consciousness, weakness and headaches.  Endo/Heme/Allergies: Negative for environmental allergies. Does not bruise/bleed easily.  Psychiatric/Behavioral: Negative for depression, hallucinations, memory loss, substance abuse and suicidal ideas. The patient is not nervous/anxious and does not have insomnia.     Past Medical History:  Patient Active Problem List   Diagnosis Date Noted  . 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 SVT (supraventricular tachycardia) (HCC) 04/04/2020  . Metabolic syndrome 04/04/2020  . Hypertension   . MVP (mitral valve prolapse) 05/30/2017    Past Surgical History:  History reviewed. No pertinent surgical history.  Gynecologic History:  No LMP recorded. (Menstrual status: Irregular Periods). Contraception: none Last Pap: 3 years ago Results were:  no abnormalities  Last mammogram: about 6 years ago Results were: BI-RAD I  Obstetric History: G2P1011  Family History:  History reviewed. No pertinent family history.  Social History:  Social History   Socioeconomic History  . Marital status: Divorced    Spouse name: Not on file  . Number of children: Not on file  . Years of education: Not on file  . Highest education level: Not on file  Occupational History  . Not on file  Tobacco Use  . Smoking status: Never Smoker  . Smokeless tobacco: Never Used  Substance and Sexual Activity  . Alcohol use: No  . Drug use: No  . Sexual activity: Not on file  Other Topics Concern  . Not on file  Social History Narrative  . Not on file   Social Determinants of Health   Financial Resource Strain:   .  Difficulty of Paying Living Expenses: Not on file  Food Insecurity:   . Worried About Programme researcher, broadcasting/film/video in the Last Year: Not on file  . Ran Out of Food in the Last Year: Not on file  Transportation Needs:   . Lack of Transportation (Medical):  Not on file  . Lack of Transportation (Non-Medical): Not on file  Physical Activity:   . Days of Exercise per Week: Not on file  . Minutes of Exercise per Session: Not on file  Stress:   . Feeling of Stress : Not on file  Social Connections:   . Frequency of Communication with Friends and Family: Not on file  . Frequency of Social Gatherings with Friends and Family: Not on file  . Attends Religious Services: Not on file  . Active Member of Clubs or Organizations: Not on file  . Attends Banker Meetings: Not on file  . Marital Status: Not on file  Intimate Partner Violence:   . Fear of Current or Ex-Partner: Not on file  . Emotionally Abused: Not on file  . Physically Abused: Not on file  . Sexually Abused: Not on file    Allergies:  No Known Allergies  Medications: Prior to Admission medications   Medication Sig Start Date End Date Taking? Authorizing Provider  amLODipine (NORVASC) 2.5 MG tablet  04/02/17  Yes [provider]  hydrochlorothiazide (MICROZIDE) 12.5 MG capsule Take 1 capsule (12.5 mg total) by mouth daily. 07/03/20  Yes Irish Lack, FNP  metoprolol tartrate (LOPRESSOR) 50 MG tablet Take 50 mg by mouth.   Yes [provider]  montelukast (SINGULAIR) 10 MG tablet Take 10 mg by mouth at bedtime.   Yes [provider]  pantoprazole (PROTONIX) 40 MG tablet  05/11/17  Yes [provider]  PROAIR HFA 108 [id number] Base) MCG/ACT inhaler  05/11/17  Yes [provider]  SUMAtriptan (IMITREX) 100 MG tablet  05/11/17  Yes [provider]  SYMBICORT 160-4.5 MCG/ACT inhaler Inhale 1 puff into the lungs 2 (two) times daily. 12/23/19  Yes [provider]    Physical Exam Vitals: Blood pressure 126/84, height 5\' 8"  (1.727 m), weight 199 lb (90.3 kg).  General: NAD HEENT: normocephalic, anicteric Thyroid: no enlargement, no palpable nodules Pulmonary: No increased work of breathing, CTAB Cardiovascular: RRR,  distal pulses 2+ Breast: Breast symmetrical, no tenderness, no palpable nodules or masses, no skin or nipple retraction present, no nipple discharge.  No axillary or supraclavicular lymphadenopathy. Abdomen: NABS, soft, non-tender, non-distended.  Umbilicus without lesions.  No hepatomegaly, splenomegaly or masses palpable. No evidence of hernia  Genitourinary:  External: Normal external female genitalia.  Normal urethral meatus, normal Bartholin's and Skene's glands.    Vagina: Normal vaginal mucosa, no evidence of prolapse.    Cervix: Grossly normal in appearance, no bleeding, no CMT  Uterus: Non-enlarged, mobile, normal contour.    Adnexa: ovaries non-enlarged, no adnexal masses  Rectal: deferred  Lymphatic: no evidence of inguinal lymphadenopathy Extremities: no edema, erythema, or tenderness Neurologic: Grossly intact Psychiatric: mood appropriate, affect full   Assessment: 44 y.o. G2P1011 routine annual exam  Plan: Problem List Items Addressed This Visit    None    Visit Diagnoses    Well woman exam with routine gynecological exam    -  Primary   Relevant Orders   Cytology - PAP   Cervical cancer screening       Relevant Orders   Cytology - PAP  1) Mammogram - recommend yearly screening mammogram.  Mammogram declines  2) STI screening  was offered and declined  3) ASCCP guidelines and rationale discussed.  Patient opts for every 3 years screening interval  4) Contraception - the patient is currently using  none.  She is happy with her current form of contraception and plans to continue. She understands there is a low chance of conception.  5) Colonoscopy: declines screening at this time. Screening recommended starting at age 23 for average risk individuals, age 35 for individuals deemed at increased risk (including African Americans) and recommended to continue until age 25.  For patient age 57-85 individualized approach is recommended.  Gold standard screening is  via colonoscopy, Cologuard screening is an acceptable alternative for patient unwilling or unable to undergo colonoscopy.  "Colorectal cancer screening for average?risk adults: 2018 guideline update from the American Cancer Society"CA: A Cancer Journal for Clinicians: Feb 22, 2017   6) Routine healthcare maintenance including cholesterol, diabetes screening discussed managed by PCP  7) Return in about 1 year (around 07/29/2021) for annual established gyn.   Tresea Mall, CNM [facility] 07/30/2020, 9:46 AM

## 2020-07-31 ENCOUNTER — Encounter: Payer: Self-pay | Admitting: Family Medicine

## 2020-07-31 ENCOUNTER — Ambulatory Visit (INDEPENDENT_AMBULATORY_CARE_PROVIDER_SITE_OTHER): Payer: BC Managed Care – PPO | Admitting: Family Medicine

## 2020-07-31 VITALS — BP 182/81 | HR 92 | Ht 68.0 in | Wt 199.3 lb

## 2020-07-31 DIAGNOSIS — I10 Essential (primary) hypertension: Secondary | ICD-10-CM | POA: Diagnosis not present

## 2020-07-31 MED ORDER — AMLODIPINE BESYLATE 5 MG PO TABS: 5.0000 mg | ORAL_TABLET | Freq: Every day | ORAL | 2 refills | Status: AC

## 2020-07-31 NOTE — Assessment & Plan Note (Signed)
Not at goal today, pt has had recent migraine ha, no CP or SOB.

## 2020-07-31 NOTE — Progress Notes (Signed)
Established Patient Office Visit  SUBJECTIVE:  Subjective  Patient ID: Ashley Leon, female    DOB: 15-Aug-1976  Age: 44 y.o. MRN: [id number]  CC:  Chief Complaint  Patient presents with  . Hypertension    HPI Ashley Leon is a 44 y.o. female presenting today for     Past Medical History:  Diagnosis Date  . Asthma   . Hypertension   . Mitral prolapse     History reviewed. No pertinent surgical history.  History reviewed. No pertinent family history.  Social History   Socioeconomic History  . Marital status: Divorced    Spouse name: Not on file  . Number of children: Not on file  . Years of education: Not on file  . Highest education level: Not on file  Occupational History  . Not on file  Tobacco Use  . Smoking status: Never Smoker  . Smokeless tobacco: Never Used  Substance and Sexual Activity  . Alcohol use: No  . Drug use: No  . Sexual activity: Not on file  Other Topics Concern  . Not on file  Social History Narrative  . Not on file   Social Determinants of Health   Financial Resource Strain:   . Difficulty of Paying Living Expenses: Not on file  Food Insecurity:   . Worried About Programme researcher, broadcasting/film/video in the Last Year: Not on file  . Ran Out of Food in the Last Year: Not on file  Transportation Needs:   . Lack of Transportation (Medical): Not on file  . Lack of Transportation (Non-Medical): Not on file  Physical Activity:   . Days of Exercise per Week: Not on file  . Minutes of Exercise per Session: Not on file  Stress:   . Feeling of Stress : Not on file  Social Connections:   . Frequency of Communication with Friends and Family: Not on file  . Frequency of Social Gatherings with Friends and Family: Not on file  . Attends Religious Services: Not on file  . Active Member of Clubs or Organizations: Not on file  . Attends Banker Meetings: Not on file  . Marital Status: Not on file  Intimate Partner Violence:   .  Fear of Current or Ex-Partner: Not on file  . Emotionally Abused: Not on file  . Physically Abused: Not on file  . Sexually Abused: Not on file     Current Outpatient Medications:  .  amLODipine (NORVASC) 5 MG tablet, Take 1 tablet (5 mg total) by mouth daily., Disp: 90 tablet, Rfl: 2 .  hydrochlorothiazide (MICROZIDE) 12.5 MG capsule, Take 1 capsule (12.5 mg total) by mouth daily., Disp: 30 capsule, Rfl: 3 .  metoprolol tartrate (LOPRESSOR) 50 MG tablet, Take 50 mg by mouth., Disp: , Rfl:  .  montelukast (SINGULAIR) 10 MG tablet, Take 10 mg by mouth at bedtime., Disp: , Rfl:  .  pantoprazole (PROTONIX) 40 MG tablet, , Disp: , Rfl:  .  PROAIR HFA 108 (90 Base) MCG/ACT inhaler, , Disp: , Rfl:  .  SUMAtriptan (IMITREX) 100 MG tablet, , Disp: , Rfl:  .  SYMBICORT 160-4.5 MCG/ACT inhaler, Inhale 1 puff into the lungs 2 (two) times daily., Disp: , Rfl:    No Known Allergies  ROS Review of Systems  HENT: Negative.   Respiratory: Negative.   Cardiovascular: Negative.   Musculoskeletal: Negative.      OBJECTIVE:    Physical Exam Vitals and nursing note reviewed.  Eyes:  Pupils: Pupils are equal, round, and reactive to light.  Cardiovascular:     Rate and Rhythm: Normal rate.  Musculoskeletal:     Cervical back: Normal range of motion.  Psychiatric:        Mood and Affect: Mood normal.     BP (!) 182/81   Pulse 92   Ht 5\' 8"  (1.727 m)   Wt 199 lb 4.8 oz (90.4 kg)   BMI 30.30 kg/m  Wt Readings from Last 3 Encounters:  07/31/20 199 lb 4.8 oz (90.4 kg)  07/29/20 199 lb (90.3 kg)  07/03/20 197 lb 11.2 oz (89.7 kg)    Health Maintenance Due  Topic Date Due  . Hepatitis C Screening  Never done  . COVID-19 Vaccine (1) Never done  . HIV Screening  Never done  . TETANUS/TDAP  Never done  . INFLUENZA VACCINE  Never done  . PAP SMEAR-Modifier  05/30/2020    There are no preventive care reminders to display for this patient.  CBC Latest Ref Rng & Units 02/24/2019  01/29/2016  WBC 4.0 - 10.5 K/uL 7.3 5.0  Hemoglobin 12.0 - 15.0 g/dL 11.4(L) 11.6(L)  Hematocrit 36 - 46 % 34.9(L) 35.2  Platelets 150 - 400 K/uL 243 259   CMP Latest Ref Rng & Units 02/24/2019 01/29/2016  Glucose 70 - 99 mg/dL 03/30/2016) 99  BUN 6 - 20 mg/dL 10 12  Creatinine 119(E - 1.00 mg/dL 1.74 0.81  Sodium 4.48 - 145 mmol/L 141 141  Potassium 3.5 - 5.1 mmol/L 3.8 4.4  Chloride 98 - 111 mmol/L 105 105  CO2 22 - 32 mmol/L 26 27  Calcium 8.9 - 10.3 mg/dL 9.8 9.8  Total Protein 6.5 - 8.1 g/dL 185) -  Total Bilirubin 0.3 - 1.2 mg/dL 0.4 -  Alkaline Phos 38 - 126 U/L 119 -  AST 15 - 41 U/L 22 -  ALT 0 - 44 U/L 10 -    No results found for: TSH Lab Results  Component Value Date   ALBUMIN 4.8 02/24/2019   ANIONGAP 10 02/24/2019   No results found for: CHOL, HDL, LDLCALC, CHOLHDL No results found for: TRIG No results found for: HGBA1C    ASSESSMENT & PLAN:   Problem List Items Addressed This Visit      Cardiovascular and Mediastinum   Hypertension - Primary    Not at goal today, pt has had recent migraine ha, no CP or SOB.       Relevant Medications   amLODipine (NORVASC) 5 MG tablet      Meds ordered this encounter  Medications  . amLODipine (NORVASC) 5 MG tablet    Sig: Take 1 tablet (5 mg total) by mouth daily.    Dispense:  90 tablet    Refill:  2      Follow-up: No follow-ups on file.    02/26/2019, FNP [facility] [address], [place], [place]

## 2020-08-03 LAB — CYTOLOGY - PAP
Comment: NEGATIVE
Diagnosis: NEGATIVE
High risk HPV: NEGATIVE

## 2020-08-14 ENCOUNTER — Encounter: Payer: Self-pay | Admitting: Family Medicine

## 2020-08-14 ENCOUNTER — Other Ambulatory Visit: Payer: Self-pay

## 2020-08-14 ENCOUNTER — Ambulatory Visit: Payer: BC Managed Care – PPO | Admitting: Family Medicine

## 2020-08-14 VITALS — BP 175/89 | HR 87 | Ht 68.0 in | Wt 200.3 lb

## 2020-08-14 DIAGNOSIS — G43709 Chronic migraine without aura, not intractable, without status migrainosus: Secondary | ICD-10-CM

## 2020-08-14 DIAGNOSIS — I10 Essential (primary) hypertension: Secondary | ICD-10-CM | POA: Diagnosis not present

## 2020-08-14 MED ORDER — ZEMBRACE SYMTOUCH 3 MG/0.5ML ~~LOC~~ SOAJ: 3.0000 mg | SUBCUTANEOUS | 2 refills | Status: AC | PRN

## 2020-08-14 MED ORDER — HYDROCHLOROTHIAZIDE 25 MG PO TABS: 25.0000 mg | ORAL_TABLET | Freq: Every day | ORAL | 3 refills | Status: AC

## 2020-08-14 MED ORDER — HYDROCHLOROTHIAZIDE 25 MG PO TABS: 25.0000 mg | ORAL_TABLET | Freq: Every day | ORAL | 3 refills | Status: DC

## 2020-08-14 NOTE — Progress Notes (Signed)
Established Patient Office Visit  SUBJECTIVE:  Subjective  Patient ID: Ashley Leon, female    DOB: 12-Dec-1975  Age: 44 y.o. MRN: [id number]  CC:  Chief Complaint  Patient presents with   Hypertension    Patient is here today for a bp follow up     HPI Ashley Leon is a 44 y.o. female presenting today for     Past Medical History:  Diagnosis Date   Asthma    Hypertension    Mitral prolapse     History reviewed. No pertinent surgical history.  History reviewed. No pertinent family history.  Social History   Socioeconomic History   Marital status: Divorced    Spouse name: Not on file   Number of children: Not on file   Years of education: Not on file   Highest education level: Not on file  Occupational History   Not on file  Tobacco Use   Smoking status: Never Smoker   Smokeless tobacco: Never Used  Substance and Sexual Activity   Alcohol use: No   Drug use: No   Sexual activity: Not on file  Other Topics Concern   Not on file  Social History Narrative   Not on file   Social Determinants of Health   Financial Resource Strain:    Difficulty of Paying Living Expenses: Not on file  Food Insecurity:    Worried About Running Out of Food in the Last Year: Not on file   Ran Out of Food in the Last Year: Not on file  Transportation Needs:    Lack of Transportation (Medical): Not on file   Lack of Transportation (Non-Medical): Not on file  Physical Activity:    Days of Exercise per Week: Not on file   Minutes of Exercise per Session: Not on file  Stress:    Feeling of Stress : Not on file  Social Connections:    Frequency of Communication with Friends and Family: Not on file   Frequency of Social Gatherings with Friends and Family: Not on file   Attends Religious Services: Not on file   Active Member of Clubs or Organizations: Not on file   Attends Banker Meetings: Not on file   Marital Status:  Not on file  Intimate Partner Violence:    Fear of Current or Ex-Partner: Not on file   Emotionally Abused: Not on file   Physically Abused: Not on file   Sexually Abused: Not on file     Current Outpatient Medications:    amLODipine (NORVASC) 5 MG tablet, Take 1 tablet (5 mg total) by mouth daily., Disp: 90 tablet, Rfl: 2   hydrochlorothiazide (HYDRODIURIL) 25 MG tablet, Take 1 tablet (25 mg total) by mouth daily., Disp: 90 tablet, Rfl: 3   metoprolol tartrate (LOPRESSOR) 50 MG tablet, Take 50 mg by mouth., Disp: , Rfl:    montelukast (SINGULAIR) 10 MG tablet, Take 10 mg by mouth at bedtime., Disp: , Rfl:    pantoprazole (PROTONIX) 40 MG tablet, , Disp: , Rfl:    PROAIR HFA 108 (90 Base) MCG/ACT inhaler, , Disp: , Rfl:    SUMAtriptan Succinate (ZEMBRACE SYMTOUCH) 3 MG/0.5ML SOAJ, Inject 3 mg into the skin as needed., Disp: 0.5 mL, Rfl: 2   SYMBICORT 160-4.5 MCG/ACT inhaler, Inhale 1 puff into the lungs 2 (two) times daily., Disp: , Rfl:    Allergies  Allergen Reactions   Ace Inhibitors Swelling    ROS Review of Systems  OBJECTIVE:    Physical Exam  BP (!) 175/89    Pulse 87    Ht 5\' 8"  (1.727 m)    Wt 200 lb 4.8 oz (90.9 kg)    BMI 30.46 kg/m  Wt Readings from Last 3 Encounters:  08/14/20 200 lb 4.8 oz (90.9 kg)  07/31/20 199 lb 4.8 oz (90.4 kg)  07/29/20 199 lb (90.3 kg)    Health Maintenance Due  Topic Date Due   Hepatitis C Screening  Never done   COVID-19 Vaccine (1) Never done   HIV Screening  Never done   TETANUS/TDAP  Never done   INFLUENZA VACCINE  Never done    There are no preventive care reminders to display for this patient.  CBC Latest Ref Rng & Units 02/24/2019 01/29/2016  WBC 4.0 - 10.5 K/uL 7.3 5.0  Hemoglobin 12.0 - 15.0 g/dL 11.4(L) 11.6(L)  Hematocrit 36 - 46 % 34.9(L) 35.2  Platelets 150 - 400 K/uL 243 259   CMP Latest Ref Rng & Units 02/24/2019 01/29/2016  Glucose 70 - 99 mg/dL 03/30/2016) 99  BUN 6 - 20 mg/dL 10 12  Creatinine  086(V - 1.00 mg/dL 7.84 6.96  Sodium 2.95 - 145 mmol/L 141 141  Potassium 3.5 - 5.1 mmol/L 3.8 4.4  Chloride 98 - 111 mmol/L 105 105  CO2 22 - 32 mmol/L 26 27  Calcium 8.9 - 10.3 mg/dL 9.8 9.8  Total Protein 6.5 - 8.1 g/dL 284) -  Total Bilirubin 0.3 - 1.2 mg/dL 0.4 -  Alkaline Phos 38 - 126 U/L 119 -  AST 15 - 41 U/L 22 -  ALT 0 - 44 U/L 10 -    No results found for: TSH Lab Results  Component Value Date   ALBUMIN 4.8 02/24/2019   ANIONGAP 10 02/24/2019   No results found for: CHOL, HDL, LDLCALC, CHOLHDL No results found for: TRIG No results found for: HGBA1C    ASSESSMENT & PLAN:   Problem List Items Addressed This Visit      Cardiovascular and Mediastinum   Hypertension - Primary   Relevant Medications   hydrochlorothiazide (HYDRODIURIL) 25 MG tablet    Other Visit Diagnoses    Chronic migraine without aura without status migrainosus, not intractable       Relevant Medications   SUMAtriptan Succinate (ZEMBRACE SYMTOUCH) 3 MG/0.5ML SOAJ   hydrochlorothiazide (HYDRODIURIL) 25 MG tablet      Meds ordered this encounter  Medications   DISCONTD: hydrochlorothiazide (HYDRODIURIL) 25 MG tablet    Sig: Take 1 tablet (25 mg total) by mouth daily.    Dispense:  90 tablet    Refill:  3   SUMAtriptan Succinate (ZEMBRACE SYMTOUCH) 3 MG/0.5ML SOAJ    Sig: Inject 3 mg into the skin as needed.    Dispense:  0.5 mL    Refill:  2   hydrochlorothiazide (HYDRODIURIL) 25 MG tablet    Sig: Take 1 tablet (25 mg total) by mouth daily.    Dispense:  90 tablet    Refill:  3      Follow-up: No follow-ups on file.    02/26/2019, FNP [facility] [address], [place], [place]

## 2020-08-28 ENCOUNTER — Other Ambulatory Visit: Payer: Self-pay

## 2020-08-28 ENCOUNTER — Encounter: Payer: Self-pay | Admitting: Internal Medicine

## 2020-08-28 ENCOUNTER — Ambulatory Visit (INDEPENDENT_AMBULATORY_CARE_PROVIDER_SITE_OTHER): Payer: BC Managed Care – PPO | Admitting: Internal Medicine

## 2020-08-28 VITALS — BP 140/85 | HR 82 | Ht 68.0 in | Wt 200.5 lb

## 2020-08-28 DIAGNOSIS — I10 Essential (primary) hypertension: Secondary | ICD-10-CM

## 2020-08-28 DIAGNOSIS — E8881 Metabolic syndrome: Secondary | ICD-10-CM

## 2020-08-28 DIAGNOSIS — G43709 Chronic migraine without aura, not intractable, without status migrainosus: Secondary | ICD-10-CM

## 2020-08-28 DIAGNOSIS — I341 Nonrheumatic mitral (valve) prolapse: Secondary | ICD-10-CM

## 2020-08-28 NOTE — Assessment & Plan Note (Signed)
Patient has a migraine headache and uses Imitrex intermittently at home.  At work she cannot take it so she uses Tylenol.

## 2020-08-28 NOTE — Assessment & Plan Note (Signed)
-   I encouraged the patient to lose weight.  - I educated them on making healthy dietary choices including eating more fruits and vegetables and less fried foods. - I encouraged the patient to exercise more, and educated on the benefits of exercise including weight loss, diabetes prevention, and hypertension prevention.   

## 2020-08-28 NOTE — Progress Notes (Signed)
Established Patient Office Visit  Subjective:  Patient ID: Ashley Leon, female    DOB: 23-Apr-1976  Age: 44 y.o. MRN: [id number]  CC:  Chief Complaint  Patient presents with  . Hypertension    patient here today to follow up on her BP. She states that it is still elevated at times.     Hypertension This is a chronic problem. Associated symptoms include headaches and peripheral edema. Pertinent negatives include no anxiety, blurred vision, chest pain, malaise/fatigue, neck pain, orthopnea, palpitations, PND or shortness of breath. Risk factors for coronary artery disease include obesity. There is no history of kidney disease, CAD/MI, CVA, heart failure or PVD. There is no history of sleep apnea.    Ashley Leon presents for bp check  Past Medical History:  Diagnosis Date  . Asthma   . Hypertension   . Mitral prolapse     History reviewed. No pertinent surgical history.  History reviewed. No pertinent family history.  Social History   Socioeconomic History  . Marital status: Divorced    Spouse name: Not on file  . Number of children: Not on file  . Years of education: Not on file  . Highest education level: Not on file  Occupational History  . Not on file  Tobacco Use  . Smoking status: Never Smoker  . Smokeless tobacco: Never Used  Substance and Sexual Activity  . Alcohol use: No  . Drug use: No  . Sexual activity: Not on file  Other Topics Concern  . Not on file  Social History Narrative  . Not on file   Social Determinants of Health   Financial Resource Strain:   . Difficulty of Paying Living Expenses: Not on file  Food Insecurity:   . Worried About Programme researcher, broadcasting/film/video in the Last Year: Not on file  . Ran Out of Food in the Last Year: Not on file  Transportation Needs:   . Lack of Transportation (Medical): Not on file  . Lack of Transportation (Non-Medical): Not on file  Physical Activity:   . Days of Exercise per Week: Not on file  .  Minutes of Exercise per Session: Not on file  Stress:   . Feeling of Stress : Not on file  Social Connections:   . Frequency of Communication with Friends and Family: Not on file  . Frequency of Social Gatherings with Friends and Family: Not on file  . Attends Religious Services: Not on file  . Active Member of Clubs or Organizations: Not on file  . Attends Banker Meetings: Not on file  . Marital Status: Not on file  Intimate Partner Violence:   . Fear of Current or Ex-Partner: Not on file  . Emotionally Abused: Not on file  . Physically Abused: Not on file  . Sexually Abused: Not on file     Current Outpatient Medications:  .  amLODipine (NORVASC) 5 MG tablet, Take 1 tablet (5 mg total) by mouth daily., Disp: 90 tablet, Rfl: 2 .  hydrochlorothiazide (HYDRODIURIL) 25 MG tablet, Take 1 tablet (25 mg total) by mouth daily., Disp: 90 tablet, Rfl: 3 .  metoprolol tartrate (LOPRESSOR) 50 MG tablet, Take 50 mg by mouth., Disp: , Rfl:  .  montelukast (SINGULAIR) 10 MG tablet, Take 10 mg by mouth at bedtime., Disp: , Rfl:  .  pantoprazole (PROTONIX) 40 MG tablet, , Disp: , Rfl:  .  PROAIR HFA 108 (90 Base) MCG/ACT inhaler, , Disp: , Rfl:  .  SUMAtriptan Succinate (ZEMBRACE SYMTOUCH) 3 MG/0.5ML SOAJ, Inject 3 mg into the skin as needed., Disp: 0.5 mL, Rfl: 2 .  SYMBICORT 160-4.5 MCG/ACT inhaler, Inhale 1 puff into the lungs 2 (two) times daily., Disp: , Rfl:    Allergies  Allergen Reactions  . Ace Inhibitors Swelling    ROS Review of Systems  Constitutional: Negative.  Negative for malaise/fatigue.  HENT: Negative.   Eyes: Negative.  Negative for blurred vision.  Respiratory: Negative.  Negative for shortness of breath.   Cardiovascular: Negative.  Negative for chest pain, palpitations, orthopnea and PND.  Gastrointestinal: Negative.   Endocrine: Negative.   Genitourinary: Negative.   Musculoskeletal: Negative.  Negative for neck pain.  Skin: Negative.    Allergic/Immunologic: Negative.   Neurological: Positive for headaches.  Hematological: Negative.   Psychiatric/Behavioral: Negative.   All other systems reviewed and are negative.     Objective:    Physical Exam Vitals reviewed.  Constitutional:      Appearance: Normal appearance.  HENT:     Mouth/Throat:     Mouth: Mucous membranes are moist.  Eyes:     Pupils: Pupils are equal, round, and reactive to light.  Neck:     Vascular: No carotid bruit.  Cardiovascular:     Rate and Rhythm: Normal rate and regular rhythm.     Pulses: Normal pulses.     Heart sounds: Normal heart sounds.  Pulmonary:     Effort: Pulmonary effort is normal.     Breath sounds: Normal breath sounds.  Abdominal:     General: Bowel sounds are normal.     Palpations: Abdomen is soft. There is no hepatomegaly, splenomegaly or mass.     Tenderness: There is no abdominal tenderness.     Hernia: No hernia is present.  Musculoskeletal:        General: No tenderness.     Cervical back: Neck supple.     Right lower leg: No edema.     Left lower leg: No edema.  Skin:    Findings: No rash.  Neurological:     Mental Status: She is alert and oriented to person, place, and time.     Motor: No weakness.  Psychiatric:        Mood and Affect: Mood and affect normal.        Behavior: Behavior normal.     BP 140/85   Pulse 82   Ht 5\' 8"  (1.727 m)   Wt 200 lb 8 oz (90.9 kg)   BMI 30.49 kg/m  Wt Readings from Last 3 Encounters:  08/28/20 200 lb 8 oz (90.9 kg)  08/14/20 200 lb 4.8 oz (90.9 kg)  07/31/20 199 lb 4.8 oz (90.4 kg)     Health Maintenance Due  Topic Date Due  . Hepatitis C Screening  Never done  . COVID-19 Vaccine (1) Never done  . HIV Screening  Never done  . TETANUS/TDAP  Never done  . INFLUENZA VACCINE  Never done    There are no preventive care reminders to display for this patient.  No results found for: TSH Lab Results  Component Value Date   WBC 7.3 02/24/2019   HGB  11.4 (L) 02/24/2019   HCT 34.9 (L) 02/24/2019   MCV 84.1 02/24/2019   PLT 243 02/24/2019   Lab Results  Component Value Date   NA 141 02/24/2019   K 3.8 02/24/2019   CO2 26 02/24/2019   GLUCOSE 100 (H) 02/24/2019   BUN 10 02/24/2019  CREATININE 0.75 02/24/2019   BILITOT 0.4 02/24/2019   ALKPHOS 119 02/24/2019   AST 22 02/24/2019   ALT 10 02/24/2019   PROT 8.6 (H) 02/24/2019   ALBUMIN 4.8 02/24/2019   CALCIUM 9.8 02/24/2019   ANIONGAP 10 02/24/2019   No results found for: CHOL No results found for: HDL No results found for: LDLCALC No results found for: TRIG No results found for: CHOLHDL No results found for: QIHK7Q    Assessment & Plan:   Problem List Items Addressed This Visit      Cardiovascular and Mediastinum   MVP (mitral valve prolapse)    Patient has a prolapse of the mitral he does not have any episode of supraventricular tachycardia on beta-blockers.  Her blood pressure today is 140/85      Essential hypertension - Primary    - Today, the patient's blood pressure is well managed on triple regime. - The patient will continue the current treatment regimen.  - I encouraged the patient to eat a low-sodium diet to help control blood pressure. - I encouraged the patient to live an active lifestyle and complete activities that increases heart rate to 85% target heart rate at least 5 times per week for one hour.          Chronic migraine without aura without status migrainosus, not intractable    Patient has a migraine headache and uses Imitrex intermittently at home.  At work she cannot take it so she uses Tylenol.        Other   Metabolic syndrome    - I encouraged the patient to lose weight.  - I educated them on making healthy dietary choices including eating more fruits and vegetables and less fried foods. - I encouraged the patient to exercise more, and educated on the benefits of exercise including weight loss, diabetes prevention, and hypertension  prevention.           No orders of the defined types were placed in this encounter.   Follow-up: No follow-ups on file.    Corky Downs, MD

## 2020-08-28 NOTE — Assessment & Plan Note (Signed)
-   Today, the patient's blood pressure is well managed on triple regime. - The patient will continue the current treatment regimen.  - I encouraged the patient to eat a low-sodium diet to help control blood pressure. - I encouraged the patient to live an active lifestyle and complete activities that increases heart rate to 85% target heart rate at least 5 times per week for one hour.

## 2020-08-28 NOTE — Assessment & Plan Note (Signed)
Patient has a prolapse of the mitral he does not have any episode of supraventricular tachycardia on beta-blockers.  Her blood pressure today is 140/85

## 2020-09-25 ENCOUNTER — Other Ambulatory Visit: Payer: Self-pay | Admitting: Internal Medicine

## 2020-10-01 ENCOUNTER — Other Ambulatory Visit: Payer: Self-pay

## 2020-10-01 ENCOUNTER — Encounter: Payer: Self-pay | Admitting: Family Medicine

## 2020-10-01 ENCOUNTER — Ambulatory Visit: Payer: BC Managed Care – PPO | Admitting: Family Medicine

## 2020-10-01 VITALS — BP 165/81 | HR 80 | Ht 68.0 in | Wt 202.8 lb

## 2020-10-01 DIAGNOSIS — I10 Essential (primary) hypertension: Secondary | ICD-10-CM | POA: Diagnosis not present

## 2020-10-01 MED ORDER — AMOXICILLIN 500 MG PO CAPS: 500.0000 mg | ORAL_CAPSULE | Freq: Three times a day (TID) | ORAL | 0 refills | Status: DC

## 2020-10-01 NOTE — Assessment & Plan Note (Signed)
Patient's blood pressure is within the desired range. Medication side effects include: no side effects noted Continue current treatment regimen.  Patient not at goal, home BP have been wnl, will fu in 3 months.

## 2020-10-01 NOTE — Progress Notes (Signed)
Established Patient Office Visit  SUBJECTIVE:  Subjective  Patient ID: Ashley Leon, female    DOB: 1976/04/10  Age: 45 y.o. MRN: [id number]  CC:  Chief Complaint  Patient presents with  . Hypertension    Patient is here for Bp follow up    HPI Ashley Leon is a 45 y.o. female presenting today for     Past Medical History:  Diagnosis Date  . Asthma   . Hypertension   . Mitral prolapse     History reviewed. No pertinent surgical history.  History reviewed. No pertinent family history.  Social History   Socioeconomic History  . Marital status: Divorced    Spouse name: Not on file  . Number of children: Not on file  . Years of education: Not on file  . Highest education level: Not on file  Occupational History  . Not on file  Tobacco Use  . Smoking status: Never Smoker  . Smokeless tobacco: Never Used  Substance and Sexual Activity  . Alcohol use: No  . Drug use: No  . Sexual activity: Not on file  Other Topics Concern  . Not on file  Social History Narrative  . Not on file   Social Determinants of Health   Financial Resource Strain: Not on file  Food Insecurity: Not on file  Transportation Needs: Not on file  Physical Activity: Not on file  Stress: Not on file  Social Connections: Not on file  Intimate Partner Violence: Not on file     Current Outpatient Medications:  .  amoxicillin (AMOXIL) 500 MG capsule, Take 1 capsule (500 mg total) by mouth 3 (three) times daily., Disp: 30 capsule, Rfl: 0 .  amLODipine (NORVASC) 5 MG tablet, Take 1 tablet (5 mg total) by mouth daily., Disp: 90 tablet, Rfl: 2 .  hydrochlorothiazide (HYDRODIURIL) 25 MG tablet, Take 1 tablet (25 mg total) by mouth daily., Disp: 90 tablet, Rfl: 3 .  metoprolol tartrate (LOPRESSOR) 50 MG tablet, Take 1 tablet by mouth twice daily, Disp: 60 tablet, Rfl: 0 .  montelukast (SINGULAIR) 10 MG tablet, Take 10 mg by mouth at bedtime., Disp: , Rfl:  .  pantoprazole (PROTONIX)  40 MG tablet, , Disp: , Rfl:  .  PROAIR HFA 108 (90 Base) MCG/ACT inhaler, , Disp: , Rfl:  .  SUMAtriptan Succinate (ZEMBRACE SYMTOUCH) 3 MG/0.5ML SOAJ, Inject 3 mg into the skin as needed., Disp: 0.5 mL, Rfl: 2 .  SYMBICORT 160-4.5 MCG/ACT inhaler, Inhale 1 puff into the lungs 2 (two) times daily., Disp: , Rfl:    Allergies  Allergen Reactions  . Ace Inhibitors Swelling    ROS Review of Systems  Constitutional: Negative.   HENT: Negative.   Respiratory: Negative.  Negative for chest tightness and shortness of breath.   Cardiovascular: Negative.  Negative for leg swelling.  Gastrointestinal: Negative.   Endocrine: Negative.   Genitourinary: Negative.   Musculoskeletal: Negative.   Psychiatric/Behavioral: Negative.      OBJECTIVE:    Physical Exam Constitutional:      Appearance: She is obese.  HENT:     Head: Normocephalic.     Mouth/Throat:     Mouth: Mucous membranes are moist.  Eyes:     Pupils: Pupils are equal, round, and reactive to light.  Cardiovascular:     Rate and Rhythm: Normal rate and regular rhythm.  Musculoskeletal:     Cervical back: Normal range of motion.  Skin:    General: Skin is warm.  Capillary Refill: Capillary refill takes less than 2 seconds.  Neurological:     Mental Status: She is alert.  Psychiatric:        Mood and Affect: Mood normal.     BP (!) 165/81   Pulse 80   Ht 5\' 8"  (1.727 m)   Wt 202 lb 12.8 oz (92 kg)   BMI 30.84 kg/m  Wt Readings from Last 3 Encounters:  10/01/20 202 lb 12.8 oz (92 kg)  08/28/20 200 lb 8 oz (90.9 kg)  08/14/20 200 lb 4.8 oz (90.9 kg)    Health Maintenance Due  Topic Date Due  . Hepatitis C Screening  Never done  . COVID-19 Vaccine (1) Never done  . HIV Screening  Never done  . TETANUS/TDAP  Never done  . COLONOSCOPY (Pts 45-81yrs Insurance coverage will need to be confirmed)  Never done  . INFLUENZA VACCINE  Never done    There are no preventive care reminders to display for this  patient.  CBC Latest Ref Rng & Units 02/24/2019 01/29/2016  WBC 4.0 - 10.5 K/uL 7.3 5.0  Hemoglobin 12.0 - 15.0 g/dL 11.4(L) 11.6(L)  Hematocrit 36.0 - 46.0 % 34.9(L) 35.2  Platelets 150 - 400 K/uL 243 259   CMP Latest Ref Rng & Units 02/24/2019 01/29/2016  Glucose 70 - 99 mg/dL 03/30/2016) 99  BUN 6 - 20 mg/dL 10 12  Creatinine 572(I - 1.00 mg/dL 2.03 5.59  Sodium 7.41 - 145 mmol/L 141 141  Potassium 3.5 - 5.1 mmol/L 3.8 4.4  Chloride 98 - 111 mmol/L 105 105  CO2 22 - 32 mmol/L 26 27  Calcium 8.9 - 10.3 mg/dL 9.8 9.8  Total Protein 6.5 - 8.1 g/dL 638) -  Total Bilirubin 0.3 - 1.2 mg/dL 0.4 -  Alkaline Phos 38 - 126 U/L 119 -  AST 15 - 41 U/L 22 -  ALT 0 - 44 U/L 10 -    No results found for: TSH Lab Results  Component Value Date   ALBUMIN 4.8 02/24/2019   ANIONGAP 10 02/24/2019   No results found for: CHOL, HDL, LDLCALC, CHOLHDL No results found for: TRIG No results found for: HGBA1C    ASSESSMENT & PLAN:   Problem List Items Addressed This Visit      Cardiovascular and Mediastinum   Essential hypertension - Primary    Patient's blood pressure is within the desired range. Medication side effects include: no side effects noted Continue current treatment regimen.  Patient not at goal, home BP have been wnl, will fu in 3 months.           Meds ordered this encounter  Medications  . amoxicillin (AMOXIL) 500 MG capsule    Sig: Take 1 capsule (500 mg total) by mouth 3 (three) times daily.    Dispense:  30 capsule    Refill:  0      Follow-up: No follow-ups on file.    02/26/2019, FNP [facility] [address] [place], [place]

## 2020-10-22 DIAGNOSIS — E782 Mixed hyperlipidemia: Secondary | ICD-10-CM | POA: Diagnosis not present

## 2020-10-22 DIAGNOSIS — I10 Essential (primary) hypertension: Secondary | ICD-10-CM | POA: Diagnosis not present

## 2020-10-22 DIAGNOSIS — J4540 Moderate persistent asthma, uncomplicated: Secondary | ICD-10-CM | POA: Diagnosis not present

## 2020-12-31 ENCOUNTER — Encounter: Payer: Self-pay | Admitting: Family Medicine

## 2020-12-31 ENCOUNTER — Other Ambulatory Visit: Payer: Self-pay

## 2020-12-31 ENCOUNTER — Ambulatory Visit: Payer: BC Managed Care – PPO | Admitting: Family Medicine

## 2020-12-31 VITALS — BP 167/72 | HR 62 | Ht 68.0 in | Wt 201.8 lb

## 2020-12-31 DIAGNOSIS — I10 Essential (primary) hypertension: Secondary | ICD-10-CM | POA: Diagnosis not present

## 2020-12-31 MED ORDER — METOPROLOL TARTRATE 50 MG PO TABS: 50.0000 mg | ORAL_TABLET | Freq: Two times a day (BID) | ORAL | 4 refills | Status: AC

## 2020-12-31 NOTE — Progress Notes (Signed)
Established Patient Office Visit  SUBJECTIVE:  Subjective  Patient ID: Ashley Leon, female    DOB: Aug 25, 1976  Age: 45 y.o. MRN: [id number]  CC:  Chief Complaint  Patient presents with  . Hypertension    HPI Ashley Leon is a 45 y.o. female presenting today for     Past Medical History:  Diagnosis Date  . Asthma   . Hypertension   . Mitral prolapse     History reviewed. No pertinent surgical history.  History reviewed. No pertinent family history.  Social History   Socioeconomic History  . Marital status: Divorced    Spouse name: Not on file  . Number of children: Not on file  . Years of education: Not on file  . Highest education level: Not on file  Occupational History  . Not on file  Tobacco Use  . Smoking status: Never Smoker  . Smokeless tobacco: Never Used  Substance and Sexual Activity  . Alcohol use: No  . Drug use: No  . Sexual activity: Not on file  Other Topics Concern  . Not on file  Social History Narrative  . Not on file   Social Determinants of Health   Financial Resource Strain: Not on file  Food Insecurity: Not on file  Transportation Needs: Not on file  Physical Activity: Not on file  Stress: Not on file  Social Connections: Not on file  Intimate Partner Violence: Not on file     Current Outpatient Medications:  .  amLODipine (NORVASC) 5 MG tablet, Take 1 tablet (5 mg total) by mouth daily., Disp: 90 tablet, Rfl: 2 .  amoxicillin (AMOXIL) 500 MG capsule, Take 1 capsule (500 mg total) by mouth 3 (three) times daily., Disp: 30 capsule, Rfl: 0 .  hydrochlorothiazide (HYDRODIURIL) 25 MG tablet, Take 1 tablet (25 mg total) by mouth daily., Disp: 90 tablet, Rfl: 3 .  metoprolol tartrate (LOPRESSOR) 50 MG tablet, Take 1 tablet by mouth twice daily, Disp: 60 tablet, Rfl: 0 .  montelukast (SINGULAIR) 10 MG tablet, Take 10 mg by mouth at bedtime., Disp: , Rfl:  .  pantoprazole (PROTONIX) 40 MG tablet, , Disp: , Rfl:  .   PROAIR HFA 108 (90 Base) MCG/ACT inhaler, , Disp: , Rfl:  .  SYMBICORT 160-4.5 MCG/ACT inhaler, Inhale 1 puff into the lungs 2 (two) times daily., Disp: , Rfl:  .  SUMAtriptan Succinate (ZEMBRACE SYMTOUCH) 3 MG/0.5ML SOAJ, Inject 3 mg into the skin as needed., Disp: 0.5 mL, Rfl: 2   Allergies  Allergen Reactions  . Ace Inhibitors Swelling    ROS Review of Systems  Constitutional: Negative.   HENT: Negative.   Respiratory: Negative.   Cardiovascular: Negative.   Genitourinary: Negative.   Neurological: Positive for headaches.  Psychiatric/Behavioral: Negative.      OBJECTIVE:    Physical Exam Nursing note reviewed.  HENT:     Right Ear: Tympanic membrane normal.     Left Ear: Tympanic membrane normal.  Cardiovascular:     Rate and Rhythm: Normal rate and regular rhythm.  Pulmonary:     Effort: Pulmonary effort is normal.  Musculoskeletal:     Cervical back: Normal range of motion.  Skin:    General: Skin is warm.  Neurological:     General: No focal deficit present.     Mental Status: She is alert.  Psychiatric:        Mood and Affect: Mood normal.     BP (!) 167/72  Pulse 62   Ht 5\' 8"  (1.727 m)   Wt 201 lb 12.8 oz (91.5 kg)   BMI 30.68 kg/m  Wt Readings from Last 3 Encounters:  12/31/20 201 lb 12.8 oz (91.5 kg)  10/01/20 202 lb 12.8 oz (92 kg)  08/28/20 200 lb 8 oz (90.9 kg)    Health Maintenance Due  Topic Date Due  . Hepatitis C Screening  Never done  . COVID-19 Vaccine (1) Never done  . HIV Screening  Never done  . TETANUS/TDAP  Never done  . COLONOSCOPY (Pts 45-47yrs Insurance coverage will need to be confirmed)  Never done    There are no preventive care reminders to display for this patient.  CBC Latest Ref Rng & Units 02/24/2019 01/29/2016  WBC 4.0 - 10.5 K/uL 7.3 5.0  Hemoglobin 12.0 - 15.0 g/dL 11.4(L) 11.6(L)  Hematocrit 36.0 - 46.0 % 34.9(L) 35.2  Platelets 150 - 400 K/uL 243 259   CMP Latest Ref Rng & Units 02/24/2019 01/29/2016   Glucose 70 - 99 mg/dL 03/30/2016) 99  BUN 6 - 20 mg/dL 10 12  Creatinine 638(L - 1.00 mg/dL 3.73 4.28  Sodium 7.68 - 145 mmol/L 141 141  Potassium 3.5 - 5.1 mmol/L 3.8 4.4  Chloride 98 - 111 mmol/L 105 105  CO2 22 - 32 mmol/L 26 27  Calcium 8.9 - 10.3 mg/dL 9.8 9.8  Total Protein 6.5 - 8.1 g/dL 115) -  Total Bilirubin 0.3 - 1.2 mg/dL 0.4 -  Alkaline Phos 38 - 126 U/L 119 -  AST 15 - 41 U/L 22 -  ALT 0 - 44 U/L 10 -    No results found for: TSH Lab Results  Component Value Date   ALBUMIN 4.8 02/24/2019   ANIONGAP 10 02/24/2019   No results found for: CHOL, HDL, LDLCALC, CHOLHDL No results found for: TRIG No results found for: HGBA1C    ASSESSMENT & PLAN:   Problem List Items Addressed This Visit      Cardiovascular and Mediastinum   Essential hypertension - Primary    Patient with elevated BP today, has been taking Metoprolol as planned.  Denies CP or SOB.   Plan- Refil Metoprolol.          No orders of the defined types were placed in this encounter.     Follow-up: No follow-ups on file.    02/26/2019, FNP [facility] [address] [place], [place]

## 2020-12-31 NOTE — Assessment & Plan Note (Signed)
Patient with elevated BP today, has been taking Metoprolol as planned.  Denies CP or SOB.   Plan- Refil Metoprolol.

## 2021-01-01 ENCOUNTER — Other Ambulatory Visit: Payer: Self-pay | Admitting: Family Medicine

## 2021-01-21 DIAGNOSIS — I10 Essential (primary) hypertension: Secondary | ICD-10-CM | POA: Diagnosis not present

## 2021-01-21 DIAGNOSIS — Z6829 Body mass index (BMI) 29.0-29.9, adult: Secondary | ICD-10-CM | POA: Diagnosis not present

## 2021-01-21 DIAGNOSIS — J449 Chronic obstructive pulmonary disease, unspecified: Secondary | ICD-10-CM | POA: Diagnosis not present

## 2021-01-21 DIAGNOSIS — G43909 Migraine, unspecified, not intractable, without status migrainosus: Secondary | ICD-10-CM | POA: Diagnosis not present

## 2021-02-18 DIAGNOSIS — G43909 Migraine, unspecified, not intractable, without status migrainosus: Secondary | ICD-10-CM | POA: Diagnosis not present

## 2021-02-18 DIAGNOSIS — I10 Essential (primary) hypertension: Secondary | ICD-10-CM | POA: Diagnosis not present

## 2021-02-18 DIAGNOSIS — I341 Nonrheumatic mitral (valve) prolapse: Secondary | ICD-10-CM | POA: Diagnosis not present

## 2021-02-18 DIAGNOSIS — J449 Chronic obstructive pulmonary disease, unspecified: Secondary | ICD-10-CM | POA: Diagnosis not present

## 2021-02-28 ENCOUNTER — Emergency Department: Payer: BC Managed Care – PPO

## 2021-02-28 ENCOUNTER — Other Ambulatory Visit: Payer: Self-pay

## 2021-02-28 ENCOUNTER — Emergency Department
Admission: EM | Admit: 2021-02-28 | Discharge: 2021-03-01 | Disposition: A | Discharge status: Home / Self Care | Payer: BC Managed Care – PPO | Attending: Emergency Medicine | Admitting: Emergency Medicine

## 2021-02-28 DIAGNOSIS — R0789 Other chest pain: Secondary | ICD-10-CM | POA: Diagnosis not present

## 2021-02-28 DIAGNOSIS — H60502 Unspecified acute noninfective otitis externa, left ear: Secondary | ICD-10-CM | POA: Insufficient documentation

## 2021-02-28 DIAGNOSIS — Z79899 Other long term (current) drug therapy: Secondary | ICD-10-CM | POA: Diagnosis not present

## 2021-02-28 DIAGNOSIS — Z7951 Long term (current) use of inhaled steroids: Secondary | ICD-10-CM | POA: Diagnosis not present

## 2021-02-28 DIAGNOSIS — J45909 Unspecified asthma, uncomplicated: Secondary | ICD-10-CM | POA: Diagnosis not present

## 2021-02-28 DIAGNOSIS — H6092 Unspecified otitis externa, left ear: Secondary | ICD-10-CM | POA: Diagnosis not present

## 2021-02-28 DIAGNOSIS — R079 Chest pain, unspecified: Secondary | ICD-10-CM | POA: Diagnosis not present

## 2021-02-28 DIAGNOSIS — H9202 Otalgia, left ear: Secondary | ICD-10-CM | POA: Diagnosis not present

## 2021-02-28 DIAGNOSIS — H6692 Otitis media, unspecified, left ear: Secondary | ICD-10-CM

## 2021-02-28 DIAGNOSIS — I10 Essential (primary) hypertension: Secondary | ICD-10-CM | POA: Insufficient documentation

## 2021-02-28 LAB — CBC
HCT: 34.7 % — ABNORMAL LOW (ref 36.0–46.0)
Hemoglobin: 11.4 g/dL — ABNORMAL LOW (ref 12.0–15.0)
MCH: 27.7 pg (ref 26.0–34.0)
MCHC: 32.9 g/dL (ref 30.0–36.0)
MCV: 84.2 fL (ref 80.0–100.0)
Platelets: 318 10*3/uL (ref 150–400)
RBC: 4.12 MIL/uL (ref 3.87–5.11)
RDW: 12.8 % (ref 11.5–15.5)
WBC: 7.7 10*3/uL (ref 4.0–10.5)
nRBC: 0 % (ref 0.0–0.2)

## 2021-02-28 LAB — BASIC METABOLIC PANEL
Anion gap: 11 (ref 5–15)
BUN: 7 mg/dL (ref 6–20)
CO2: 23 mmol/L (ref 22–32)
Calcium: 9.5 mg/dL (ref 8.9–10.3)
Chloride: 105 mmol/L (ref 98–111)
Creatinine, Ser: 0.88 mg/dL (ref 0.44–1.00)
GFR, Estimated: >60 mL/min (ref >60)
Glucose, Bld: 96 mg/dL (ref 70–99)
Potassium: 3.4 mmol/L — ABNORMAL LOW (ref 3.5–5.1)
Sodium: 139 mmol/L (ref 135–145)

## 2021-02-28 LAB — TROPONIN I (HIGH SENSITIVITY)
Troponin I (High Sensitivity): 5 ng/L (ref <18)
Troponin I (High Sensitivity): 6 ng/L (ref <18)

## 2021-02-28 MED ORDER — AMOXICILLIN 500 MG PO CAPS
500.0000 mg | ORAL_CAPSULE | Freq: Once | ORAL | Status: AC
  Administered 2021-03-01: 500 mg via ORAL
  Filled 2021-02-28: qty 1

## 2021-02-28 MED ORDER — IBUPROFEN 800 MG PO TABS
800.0000 mg | ORAL_TABLET | Freq: Once | ORAL | Status: AC
  Administered 2021-03-01: 800 mg via ORAL
  Filled 2021-02-28: qty 1

## 2021-02-28 MED ORDER — METOPROLOL TARTRATE 50 MG PO TABS
50.0000 mg | ORAL_TABLET | Freq: Once | ORAL | Status: AC
  Administered 2021-03-01: 50 mg via ORAL
  Filled 2021-02-28: qty 1

## 2021-02-28 MED ORDER — AMLODIPINE BESYLATE 5 MG PO TABS
5.0000 mg | ORAL_TABLET | Freq: Once | ORAL | Status: AC
  Administered 2021-03-01: 5 mg via ORAL
  Filled 2021-02-28: qty 1

## 2021-02-28 NOTE — ED Triage Notes (Signed)
Pt to ED POV for left ear pain, and chest pain that started this am.  Pt ambulatory. States ear pain is worse than cp, hx HTN. Has taken meds today.   Hx mitral valve prolapse

## 2021-03-01 MED ORDER — AMOXICILLIN 875 MG PO TABS: 875.0000 mg | ORAL_TABLET | Freq: Two times a day (BID) | ORAL | 0 refills | Status: AC

## 2021-03-01 MED ORDER — CIPROFLOXACIN-DEXAMETHASONE 0.3-0.1 % OT SUSP: 4.0000 [drp] | Freq: Two times a day (BID) | OTIC | 0 refills | Status: AC

## 2021-03-01 MED ORDER — IBUPROFEN 800 MG PO TABS: 800.0000 mg | ORAL_TABLET | Freq: Three times a day (TID) | ORAL | 0 refills | Status: AC | PRN

## 2021-03-01 NOTE — ED Notes (Signed)
Patient reports started with left ear pain that began Saturday and radiates down into left jaw.  Patient states pain from left shoulder downward began on Sunday, pain described as ache and non radiating.

## 2021-03-01 NOTE — Discharge Instructions (Addendum)
You may alternate Tylenol 1000 mg every 6 hours as needed for pain, fever and Ibuprofen 800 mg every 8 hours as needed for pain, fever.  Please take Ibuprofen with food.  Do not take more than 4000 mg of Tylenol (acetaminophen) in a 24 hour period.  Please continue your blood pressure medications as prescribed.  I recommend close follow-up with your primary care doctor to have your blood pressure rechecked as it was elevated today.

## 2021-03-01 NOTE — ED Provider Notes (Signed)
[facility] Emergency Department Provider Note  ____________________________________________   Event Date/Time   First MD Initiated Contact with Patient 02/28/21 2334     (approximate)  I have reviewed the triage vital signs and the nursing notes.   HISTORY  Chief Complaint Chest Pain    HPI Ashley Leon is a 45 y.o. female with history of hypertension on amlodipine, hydrochlorothiazide and metoprolol twice daily who presents to the emergency department with complaints of 2 days of left ear pain.  States pain radiates down her jaw and causes her chest to ache.  No shortness of breath, nausea or vomiting, diaphoresis, dizziness.  No fever or cough.  She is not a diabetic.  States that her blood pressures are normally pretty elevated.  She states her doctor is trying to get it under control.  She denies any current headache, vision changes, chest pressure or tightness, numbness, tingling or focal weakness.  She has been compliant with her medications but has not had her nightly dose of metoprolol.        Past Medical History:  Diagnosis Date  . Asthma   . Hypertension   . Mitral prolapse     Patient Active Problem List   Diagnosis Date Noted  . Chronic migraine without aura without status migrainosus, not intractable 08/28/2020  . 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 SVT (supraventricular tachycardia) (HCC) 04/04/2020  . Metabolic syndrome 04/04/2020  . Essential hypertension   . MVP (mitral valve prolapse) 05/30/2017    No past surgical history on file.  Prior to Admission medications   Medication Sig Start Date End Date Taking? Authorizing Provider  amoxicillin (AMOXIL) 875 MG tablet Take 1 tablet (875 mg total) by mouth 2 (two) times daily. 03/01/21  Yes Kenny Rea, Layla Maw, DO  ciprofloxacin-dexamethasone (CIPRODEX) OTIC suspension Place 4 drops into the left ear 2 (two) times daily. For 7 days 03/01/21  Yes Bethanne Mule, Layla Maw, DO  ibuprofen (ADVIL) 800 MG  tablet Take 1 tablet (800 mg total) by mouth every 8 (eight) hours as needed for mild pain. 03/01/21  Yes Jb Dulworth N, DO  amLODipine (NORVASC) 5 MG tablet Take 1 tablet (5 mg total) by mouth daily. 07/31/20   Irish Lack, FNP  hydrochlorothiazide (HYDRODIURIL) 25 MG tablet Take 1 tablet (25 mg total) by mouth daily. 08/14/20   Irish Lack, FNP  metoprolol tartrate (LOPRESSOR) 50 MG tablet Take 1 tablet (50 mg total) by mouth 2 (two) times daily. 12/31/20   Irish Lack, FNP  montelukast (SINGULAIR) 10 MG tablet Take 10 mg by mouth at bedtime.    [provider]  pantoprazole (PROTONIX) 40 MG tablet  05/11/17   [provider]  PROAIR HFA 108 [phone] Base) MCG/ACT inhaler  05/11/17   [provider]  SUMAtriptan Succinate (ZEMBRACE SYMTOUCH) 3 MG/0.5ML SOAJ Inject 3 mg into the skin as needed. 08/14/20 09/13/20  Irish Lack, FNP  SYMBICORT 160-4.5 MCG/ACT inhaler Inhale 1 puff into the lungs 2 (two) times daily. 12/23/19   [provider]    Allergies Ace inhibitors  No family history on file.  Social History Social History   Tobacco Use  . Smoking status: Never Smoker  . Smokeless tobacco: Never Used  Substance Use Topics  . Alcohol use: No  . Drug use: No    Review of Systems Constitutional: No fever. Eyes: No visual changes. ENT: No sore throat. Cardiovascular: + chest pain. Respiratory: Denies shortness of breath. Gastrointestinal: No nausea, vomiting, diarrhea. Genitourinary:  Negative for dysuria. Musculoskeletal: Negative for back pain. Skin: Negative for rash. Neurological: Negative for focal weakness or numbness.  ____________________________________________   PHYSICAL EXAM:  VITAL SIGNS: ED Triage Vitals  Enc Vitals Group     BP 02/28/21 1846 (!) 207/102     Pulse Rate 02/28/21 1846 87     Resp 02/28/21 1846 20     Temp 02/28/21 1846 98.6 F (37 C)     Temp Source 02/28/21 1846 Oral     SpO2 02/28/21 1846 99 %      Weight 02/28/21 1847 200 lb (90.7 kg)     Height 02/28/21 1847 5\' 8"  (1.727 m)     Head Circumference --      Peak Flow --      Pain Score 02/28/21 1846 9     Pain Loc --      Pain Edu? --      Excl. in GC? --    CONSTITUTIONAL: Alert and oriented and responds appropriately to questions.  Appears uncomfortable, tearful HEAD: Normocephalic EYES: Conjunctivae clear, pupils appear equal, EOM appear intact ENT: normal nose; moist mucous membranes, patient has some tenderness to palpation over the left TMJ without facial swelling, redness or warmth.  Her left TM is erythematous, bulging without effusion or perforation.  She has significant redness, swelling and pain in the left external auditory canal.  No foreign body appreciated.  Right TM and external auditory canal appear normal. NECK: Supple, normal ROM; left cervical lymphadenopathy appreciated CARD: RRR; S1 and S2 appreciated; no murmurs, no clicks, no rubs, no gallops RESP: Normal chest excursion without splinting or tachypnea; breath sounds clear and equal bilaterally; no wheezes, no rhonchi, no rales, no hypoxia or respiratory distress, speaking full sentences ABD/GI: Normal bowel sounds; non-distended; soft, non-tender, no rebound, no guarding, no peritoneal signs, no hepatosplenomegaly BACK: The back appears normal EXT: Normal ROM in all joints; no deformity noted, no edema; no cyanosis, no calf tenderness or calf swelling SKIN: Normal color for age and race; warm; no rash on exposed skin NEURO: Moves all extremities equally, normal sensation diffusely, no facial asymmetry, normal speech PSYCH: The patient's mood and manner are appropriate.  ____________________________________________   LABS (all labs ordered are listed, but only abnormal results are displayed)  Labs Reviewed  BASIC METABOLIC PANEL - Abnormal; Notable for the following components:      Result Value   Potassium 3.4 (*)    All other components within normal  limits  CBC - Abnormal; Notable for the following components:   Hemoglobin 11.4 (*)    HCT 34.7 (*)    All other components within normal limits  POC URINE PREG, ED  TROPONIN I (HIGH SENSITIVITY)  TROPONIN I (HIGH SENSITIVITY)   ____________________________________________  EKG   EKG Interpretation  Date/Time:  Sunday February 28 2021 18:51:20 EDT Ventricular Rate:  88 PR Interval:  140 QRS Duration: 72 QT Interval:  358 QTC Calculation: 433 R Axis:   46 Text Interpretation: Normal sinus rhythm Normal ECG Confirmed by 09-30-2005 [phone]) on 02/28/2021 11:45:47 PM       ____________________________________________  RADIOLOGY 04/30/2021 Dariel Pellecchia, personally viewed and evaluated these images (plain radiographs) as part of my medical decision making, as well as reviewing the written report by the radiologist.  ED MD interpretation: Chest x-ray clear.  Official radiology report(s): DG Chest 2 View  Result Date: 02/28/2021 CLINICAL DATA:  Chest pain EXAM: CHEST - 2 VIEW COMPARISON:  02/24/2019 FINDINGS: The heart size  and mediastinal contours are within normal limits. Both lungs are clear. The visualized skeletal structures are unremarkable. IMPRESSION: No active cardiopulmonary disease. Electronically Signed   By: Deatra RobinsonKevin  Herman M.D.   On: 02/28/2021 19:46    ____________________________________________   PROCEDURES  Procedure(s) performed (including Critical Care):  Procedures   ____________________________________________   INITIAL IMPRESSION / ASSESSMENT AND PLAN / ED COURSE  As part of my medical decision making, I reviewed the following data within the electronic MEDICAL RECORD NUMBER Nursing notes reviewed and incorporated, Labs reviewed , EKG interpreted , Old EKG reviewed, Radiograph reviewed , Notes from prior ED visits and [place] Controlled Substance Database         Patient here with left otalgia causing atypical chest pain.  She appears to have otitis media as well as  otitis externa.  She drove herself to the emergency department and would like to drive home.  Will give amoxicillin, ibuprofen.  She is also hypertensive here which may be due to pain.  Will give home doses of metoprolol, amlodipine and monitor.  Chest pain seems very atypical.  She has had 2 normal, flat troponins.  Her EKG is nonischemic.  Her chest x-ray is clear.  Doubt ACS, PE, dissection.  ED PROGRESS  Patient's blood pressures are improving.  She reports feeling better and is now resting comfortably.  Will discharge home with amoxicillin, Ciprodex, ibuprofen.  Advised patient to follow-up closely with her PCP for recheck of her infection but also monitoring and management of her blood pressure.  At this time, I do not feel there is any life-threatening condition present. I have reviewed, interpreted and discussed all results (EKG, imaging, lab, urine as appropriate) and exam findings with patient/family. I have reviewed nursing notes and appropriate previous records.  I feel the patient is safe to be discharged home without further emergent workup and can continue workup as an outpatient as needed. Discussed usual and customary return precautions. Patient/family verbalize understanding and are comfortable with this plan.  Outpatient follow-up has been provided as needed. All questions have been answered.  ____________________________________________   FINAL CLINICAL IMPRESSION(S) / ED DIAGNOSES  Final diagnoses:  Acute bacterial otitis media, left  Acute otitis externa of left ear, unspecified type  Atypical chest pain  Hypertension, unspecified type     ED Discharge Orders         Ordered    amoxicillin (AMOXIL) 875 MG tablet  2 times daily        03/01/21 0128    ibuprofen (ADVIL) 800 MG tablet  Every 8 hours PRN        03/01/21 0128    ciprofloxacin-dexamethasone (CIPRODEX) OTIC suspension  2 times daily        03/01/21 0128          *Please note:  Ashley Leon was  evaluated in Emergency Department on 03/01/2021 for the symptoms described in the history of present illness. She was evaluated in the context of the global COVID-19 pandemic, which necessitated consideration that the patient might be at risk for infection with the SARS-CoV-2 virus that causes COVID-19. Institutional protocols and algorithms that pertain to the evaluation of patients at risk for COVID-19 are in a state of rapid change based on information released by regulatory bodies including the CDC and federal and state organizations. These policies and algorithms were followed during the patient's care in the ED.  Some ED evaluations and interventions may be delayed as a result of limited staffing during  and the pandemic.*   Note:  This document was prepared using Dragon voice recognition software and may include unintentional dictation errors.   Tylan Kinn, Layla Maw, DO 03/01/21 0129

## 2021-03-12 DIAGNOSIS — Z01818 Encounter for other preprocedural examination: Secondary | ICD-10-CM | POA: Diagnosis not present

## 2021-03-12 DIAGNOSIS — I341 Nonrheumatic mitral (valve) prolapse: Secondary | ICD-10-CM | POA: Diagnosis not present

## 2021-03-12 DIAGNOSIS — Z3202 Encounter for pregnancy test, result negative: Secondary | ICD-10-CM | POA: Diagnosis not present

## 2021-03-12 DIAGNOSIS — R011 Cardiac murmur, unspecified: Secondary | ICD-10-CM | POA: Diagnosis not present

## 2021-03-23 DIAGNOSIS — I10 Essential (primary) hypertension: Secondary | ICD-10-CM | POA: Diagnosis not present

## 2021-03-23 DIAGNOSIS — G43909 Migraine, unspecified, not intractable, without status migrainosus: Secondary | ICD-10-CM | POA: Diagnosis not present

## 2021-04-15 DIAGNOSIS — Z3202 Encounter for pregnancy test, result negative: Secondary | ICD-10-CM | POA: Diagnosis not present

## 2021-04-19 DIAGNOSIS — I341 Nonrheumatic mitral (valve) prolapse: Secondary | ICD-10-CM | POA: Diagnosis not present

## 2021-04-19 DIAGNOSIS — R011 Cardiac murmur, unspecified: Secondary | ICD-10-CM | POA: Diagnosis not present

## 2021-04-26 DIAGNOSIS — R079 Chest pain, unspecified: Secondary | ICD-10-CM | POA: Diagnosis not present

## 2021-04-26 DIAGNOSIS — I498 Other specified cardiac arrhythmias: Secondary | ICD-10-CM | POA: Diagnosis not present

## 2021-04-26 DIAGNOSIS — I341 Nonrheumatic mitral (valve) prolapse: Secondary | ICD-10-CM | POA: Diagnosis not present

## 2021-04-26 DIAGNOSIS — E876 Hypokalemia: Secondary | ICD-10-CM | POA: Diagnosis not present

## 2021-04-26 DIAGNOSIS — R0602 Shortness of breath: Secondary | ICD-10-CM | POA: Diagnosis not present

## 2021-07-22 DIAGNOSIS — Z1231 Encounter for screening mammogram for malignant neoplasm of breast: Secondary | ICD-10-CM | POA: Diagnosis not present

## 2021-07-22 DIAGNOSIS — E782 Mixed hyperlipidemia: Secondary | ICD-10-CM | POA: Diagnosis not present

## 2021-07-22 DIAGNOSIS — E559 Vitamin D deficiency, unspecified: Secondary | ICD-10-CM | POA: Diagnosis not present

## 2021-07-22 DIAGNOSIS — J4540 Moderate persistent asthma, uncomplicated: Secondary | ICD-10-CM | POA: Diagnosis not present

## 2021-07-22 DIAGNOSIS — G43909 Migraine, unspecified, not intractable, without status migrainosus: Secondary | ICD-10-CM | POA: Diagnosis not present

## 2021-07-22 DIAGNOSIS — I10 Essential (primary) hypertension: Secondary | ICD-10-CM | POA: Diagnosis not present

## 2021-08-04 DIAGNOSIS — I10 Essential (primary) hypertension: Secondary | ICD-10-CM | POA: Diagnosis not present

## 2021-08-04 DIAGNOSIS — I341 Nonrheumatic mitral (valve) prolapse: Secondary | ICD-10-CM | POA: Diagnosis not present

## 2021-08-04 DIAGNOSIS — R0602 Shortness of breath: Secondary | ICD-10-CM | POA: Diagnosis not present

## 2021-08-04 DIAGNOSIS — I498 Other specified cardiac arrhythmias: Secondary | ICD-10-CM | POA: Diagnosis not present

## 2022-08-24 LAB — EXTERNAL GENERIC LAB PROCEDURE: COLOGUARD: NEGATIVE

## 2023-08-09 ENCOUNTER — Other Ambulatory Visit: Payer: Self-pay | Admitting: Infectious Diseases

## 2023-08-09 DIAGNOSIS — Z1231 Encounter for screening mammogram for malignant neoplasm of breast: Secondary | ICD-10-CM

## 2023-09-14 ENCOUNTER — Other Ambulatory Visit: Payer: Self-pay | Admitting: Infectious Diseases

## 2023-09-14 DIAGNOSIS — D649 Anemia, unspecified: Secondary | ICD-10-CM

## 2023-09-14 DIAGNOSIS — I10 Essential (primary) hypertension: Secondary | ICD-10-CM

## 2023-09-19 ENCOUNTER — Ambulatory Visit
Admission: RE | Admit: 2023-09-19 | Discharge: 2023-09-19 | Disposition: A | Discharge status: Home / Self Care | Payer: BC Managed Care – PPO | Source: Ambulatory Visit | Attending: Infectious Diseases | Admitting: Infectious Diseases

## 2023-09-19 DIAGNOSIS — I10 Essential (primary) hypertension: Secondary | ICD-10-CM | POA: Insufficient documentation

## 2024-01-11 ENCOUNTER — Other Ambulatory Visit: Payer: Self-pay | Admitting: *Deleted

## 2024-01-11 ENCOUNTER — Inpatient Hospital Stay
Admission: RE | Admit: 2024-01-11 | Discharge: 2024-01-11 | Disposition: A | Discharge status: Home / Self Care | Payer: Self-pay | Source: Ambulatory Visit | Attending: Infectious Diseases | Admitting: Infectious Diseases

## 2024-01-11 DIAGNOSIS — Z1231 Encounter for screening mammogram for malignant neoplasm of breast: Secondary | ICD-10-CM

## 2024-01-18 ENCOUNTER — Ambulatory Visit
Admission: RE | Admit: 2024-01-18 | Discharge: 2024-01-18 | Disposition: A | Discharge status: Home / Self Care | Payer: Self-pay | Source: Ambulatory Visit | Attending: Infectious Diseases | Admitting: Infectious Diseases

## 2024-01-18 DIAGNOSIS — Z1231 Encounter for screening mammogram for malignant neoplasm of breast: Secondary | ICD-10-CM | POA: Diagnosis present
